# Patient Record
Sex: Male | Born: 1940 | Race: White | Hispanic: No | Marital: Single | Smoking: Never smoker
Health system: Southern US, Community
[De-identification: ages and names within clinical notes are randomized; demographics above are authoritative.]

## PROBLEM LIST (undated history)

## (undated) DIAGNOSIS — M1990 Unspecified osteoarthritis, unspecified site: Secondary | ICD-10-CM

## (undated) DIAGNOSIS — F419 Anxiety disorder, unspecified: Secondary | ICD-10-CM

## (undated) DIAGNOSIS — E785 Hyperlipidemia, unspecified: Secondary | ICD-10-CM

## (undated) DIAGNOSIS — I10 Essential (primary) hypertension: Secondary | ICD-10-CM

## (undated) DIAGNOSIS — M5030 Other cervical disc degeneration, unspecified cervical region: Secondary | ICD-10-CM

## (undated) DIAGNOSIS — K219 Gastro-esophageal reflux disease without esophagitis: Secondary | ICD-10-CM

## (undated) DIAGNOSIS — J309 Allergic rhinitis, unspecified: Secondary | ICD-10-CM

## (undated) DIAGNOSIS — M159 Polyosteoarthritis, unspecified: Secondary | ICD-10-CM

## (undated) DIAGNOSIS — F329 Major depressive disorder, single episode, unspecified: Secondary | ICD-10-CM

## (undated) DIAGNOSIS — F32A Depression, unspecified: Secondary | ICD-10-CM

## (undated) DIAGNOSIS — J984 Other disorders of lung: Secondary | ICD-10-CM

## (undated) HISTORY — DX: Other cervical disc degeneration, unspecified cervical region: M50.30

## (undated) HISTORY — DX: Anxiety disorder, unspecified: F41.9

## (undated) HISTORY — DX: Gastro-esophageal reflux disease without esophagitis: K21.9

## (undated) HISTORY — DX: Essential (primary) hypertension: I10

## (undated) HISTORY — DX: Hyperlipidemia, unspecified: E78.5

## (undated) HISTORY — DX: Depression, unspecified: F32.A

## (undated) HISTORY — DX: Major depressive disorder, single episode, unspecified: F32.9

## (undated) HISTORY — DX: Other disorders of lung: J98.4

## (undated) HISTORY — DX: Allergic rhinitis, unspecified: J30.9

## (undated) HISTORY — DX: Unspecified osteoarthritis, unspecified site: M19.90

## (undated) HISTORY — DX: Polyosteoarthritis, unspecified: M15.9

---
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
## 2011-08-02 ENCOUNTER — Ambulatory Visit: Payer: Self-pay | Admitting: Unknown Physician Specialty

## 2015-06-10 ENCOUNTER — Ambulatory Visit (INDEPENDENT_AMBULATORY_CARE_PROVIDER_SITE_OTHER): Payer: Medicare Other | Admitting: Urology

## 2015-06-10 ENCOUNTER — Encounter: Payer: Self-pay | Admitting: Urology

## 2015-06-10 VITALS — BP 118/69 | HR 71 | Ht 70.0 in | Wt 161.4 lb

## 2015-06-10 DIAGNOSIS — M5030 Other cervical disc degeneration, unspecified cervical region: Secondary | ICD-10-CM | POA: Insufficient documentation

## 2015-06-10 DIAGNOSIS — K219 Gastro-esophageal reflux disease without esophagitis: Secondary | ICD-10-CM | POA: Insufficient documentation

## 2015-06-10 DIAGNOSIS — F5221 Male erectile disorder: Secondary | ICD-10-CM | POA: Diagnosis not present

## 2015-06-10 DIAGNOSIS — J309 Allergic rhinitis, unspecified: Secondary | ICD-10-CM

## 2015-06-10 DIAGNOSIS — N411 Chronic prostatitis: Secondary | ICD-10-CM

## 2015-06-10 DIAGNOSIS — N400 Enlarged prostate without lower urinary tract symptoms: Secondary | ICD-10-CM | POA: Diagnosis not present

## 2015-06-10 DIAGNOSIS — L409 Psoriasis, unspecified: Secondary | ICD-10-CM | POA: Insufficient documentation

## 2015-06-10 DIAGNOSIS — M6530 Trigger finger, unspecified finger: Secondary | ICD-10-CM | POA: Insufficient documentation

## 2015-06-10 DIAGNOSIS — I10 Essential (primary) hypertension: Secondary | ICD-10-CM

## 2015-06-10 DIAGNOSIS — M159 Polyosteoarthritis, unspecified: Secondary | ICD-10-CM | POA: Insufficient documentation

## 2015-06-10 DIAGNOSIS — E7800 Pure hypercholesterolemia, unspecified: Secondary | ICD-10-CM | POA: Insufficient documentation

## 2015-06-10 HISTORY — DX: Essential (primary) hypertension: I10

## 2015-06-10 HISTORY — DX: Gastro-esophageal reflux disease without esophagitis: K21.9

## 2015-06-10 HISTORY — DX: Other cervical disc degeneration, unspecified cervical region: M50.30

## 2015-06-10 HISTORY — DX: Polyosteoarthritis, unspecified: M15.9

## 2015-06-10 HISTORY — DX: Allergic rhinitis, unspecified: J30.9

## 2015-06-10 LAB — URINALYSIS, COMPLETE
Bilirubin, UA: NEGATIVE
Glucose, UA: NEGATIVE
Ketones, UA: NEGATIVE
Leukocytes, UA: NEGATIVE
Nitrite, UA: NEGATIVE
Protein, UA: NEGATIVE
RBC, UA: NEGATIVE
Specific Gravity, UA: 1.01 (ref 1.005–1.030)
Urobilinogen, Ur: 0.2 mg/dL (ref 0.2–1.0)
pH, UA: 7 (ref 5.0–7.5)

## 2015-06-10 LAB — MICROSCOPIC EXAMINATION
Bacteria, UA: NONE SEEN
RBC, UA: NONE SEEN /hpf (ref 0–?)

## 2015-06-10 LAB — BLADDER SCAN AMB NON-IMAGING

## 2015-06-10 NOTE — Progress Notes (Signed)
06/10/2015 1:52 PM   Scott Bowen 1941-01-03 [id number]  Referring provider: No referring provider defined for this encounter.  Chief Complaint  Patient presents with  . Prostatitis    New patient    HPI:  say 74 year old patient who had seen Dr. Orson SlickHarmon many years ago. At that time he been treated or chronic prostatitis. At the present time he is having some voiding dysfunction and some erectile dysfunction. He gets an erection but it is not a very hard erection. He is able to have orgasm with this erection. He does not have difficulty with ejaculation. Secondarily patient has a weak stream which has been increasingly becoming weak over the past year. He has minimal nocturia and in fact goes to bed around midnight and gets up to void around 6 AM he has not had prostatitis infection or dysuria 30 years ago.    PMH: Past Medical History  Diagnosis Date  . GERD (gastroesophageal reflux disease)   . Anxiety   . Arthritis   . Depression   . Hypertension   . Hyperlipemia   . Lung cyst     Surgical History: Past Surgical History  Procedure Laterality Date  . Appendectomy  1961    Home Medications:    Medication List       This list is accurate as of: 06/10/15  1:52 PM.  Always use your most recent med list.               aspirin EC 81 MG tablet  Take by mouth.     cetirizine 10 MG tablet  Commonly known as:  ZYRTEC  Take 10 mg by mouth once a week.     escitalopram 10 MG tablet  Commonly known as:  LEXAPRO  Take by mouth.     lisinopril 10 MG tablet  Commonly known as:  PRINIVIL,ZESTRIL  TAKE ONE (1) TABLET EACH DAY     montelukast 10 MG tablet  Commonly known as:  SINGULAIR     omeprazole 20 MG capsule  Commonly known as:  PRILOSEC     OPCON-A OP  Apply to eye.     triamcinolone 55 MCG/ACT Aero nasal inhaler  Commonly known as:  NASACORT  Place into the nose.        Allergies:  Allergies  Allergen Reactions  . Doxycycline Rash  .  Erythromycin Ethylsuccinate Rash  . Sulfa Antibiotics Hives and Rash  . Tetracycline Rash    Family History: Family History  Problem Relation Age of Onset  . Bladder Cancer Neg Hx   . Prostate cancer Neg Hx   . Kidney cancer Neg Hx     Social History:  reports that he has never smoked. He does not have any smokeless tobacco history on file. He reports that he does not drink alcohol or use illicit drugs.  ROS: UROLOGY Frequent Urination?: Yes Hard to postpone urination?: No Burning/pain with urination?: No Get up at night to urinate?: Yes Leakage of urine?: No Urine stream starts and stops?: No Trouble starting stream?: Yes Do you have to strain to urinate?: No Blood in urine?: No Urinary tract infection?: No Sexually transmitted disease?: No Injury to kidneys or bladder?: No Painful intercourse?: No Weak stream?: Yes Erection problems?: Yes Penile pain?: No  Gastrointestinal Nausea?: No Vomiting?: No Indigestion/heartburn?: No Diarrhea?: No Constipation?: No  Constitutional Fever: No Night sweats?: No Weight loss?: No Fatigue?: No  Skin Skin rash/lesions?: No Itching?: No  Eyes Blurred vision?: No Double  vision?: No  Ears/Nose/Throat Sore throat?: No Sinus problems?: No  Hematologic/Lymphatic Swollen glands?: No Easy bruising?: Yes  Cardiovascular Leg swelling?: No Chest pain?: No  Respiratory Cough?: Yes Shortness of breath?: No  Endocrine Excessive thirst?: No  Musculoskeletal Back pain?: No Joint pain?: No  Neurological Headaches?: No Dizziness?: No  Psychologic Depression?: Yes Anxiety?: Yes  Physical Exam: BP 118/69 mmHg  Pulse 71  Ht  (1.778 m)  Wt 161 lb 6.4 oz (73.211 kg)  BMI 23.16 kg/m2  Constitutional:  Alert and oriented, No acute distress. HEENT: [place] AT, moist mucus membranes.  Trachea midline, no masses. Cardiovascular: No clubbing, cyanosis, or edema. Respiratory: Normal respiratory effort, no increased  work of breathing. GI: Abdomen is soft, nontender, nondistended, no abdominal masses GU: No CVA tenderness.  Circumcised penis normal testes right inguinal hernia. Rectal exam reveals a lax rectal sphincter BPH of grade to 2 over 4 no nodules tumors masses or growths Skin: No rashes, bruises or suspicious lesions. Lymph: No cervical or inguinal adenopathy. Neurologic: Grossly intact, no focal deficits, moving all 4 extremities. Psychiatric: Normal mood and affect.  Laboratory Data: No results found for: WBC, HGB, HCT, MCV, PLT  No results found for: CREATININE  No results found for: PSA  No results found for: TESTOSTERONE  No results found for: HGBA1C  Urinalysis    Component Value Date/Time   GLUCOSEU Negative 06/10/2015 1202   BILIRUBINUR Negative 06/10/2015 1202   NITRITE Negative 06/10/2015 1202   LEUKOCYTESUR Negative 06/10/2015 1202    Pertinent Imaging none  Assessment & Plan:     Erectile dysfunction and voiding dysfunction in the form of a small stream but not frequency urgency or nocturia. Cialis 5 mg for BPH with erectile dysfunction has been prescribed samples given to the patient.  1. Chronic prostatitis  2. BBH - Urinalysis, Complete - BLADDER SCAN AMB NON-IMAGING   No Follow-up on file.  Lorraine Lax, MD  [facility] [address] [place], [place] [address] [phone]

## 2015-06-18 DIAGNOSIS — Z7189 Other specified counseling: Secondary | ICD-10-CM | POA: Insufficient documentation

## 2015-06-18 DIAGNOSIS — Z709 Sex counseling, unspecified: Secondary | ICD-10-CM | POA: Insufficient documentation

## 2015-08-04 ENCOUNTER — Ambulatory Visit (INDEPENDENT_AMBULATORY_CARE_PROVIDER_SITE_OTHER): Payer: Medicare Other | Admitting: Urology

## 2015-08-04 VITALS — BP 108/71 | HR 70 | Ht 69.5 in | Wt 161.4 lb

## 2015-08-04 DIAGNOSIS — F528 Other sexual dysfunction not due to a substance or known physiological condition: Secondary | ICD-10-CM

## 2015-08-04 DIAGNOSIS — N400 Enlarged prostate without lower urinary tract symptoms: Secondary | ICD-10-CM

## 2015-08-04 DIAGNOSIS — F5221 Male erectile disorder: Secondary | ICD-10-CM

## 2015-08-04 LAB — URINALYSIS, COMPLETE
BILIRUBIN UA: NEGATIVE
GLUCOSE, UA: NEGATIVE
KETONES UA: NEGATIVE
LEUKOCYTES UA: NEGATIVE
Nitrite, UA: NEGATIVE
PROTEIN UA: NEGATIVE
RBC, UA: NEGATIVE
Urobilinogen, Ur: 0.2 mg/dL (ref 0.2–1.0)
pH, UA: 5.5 (ref 5.0–7.5)

## 2015-08-04 LAB — MICROSCOPIC EXAMINATION
Bacteria, UA: NONE SEEN
EPITHELIAL CELLS (NON RENAL): NONE SEEN /HPF (ref 0–10)
RBC, UA: NONE SEEN /hpf (ref 0–?)
WBC, UA: NONE SEEN /hpf (ref 0–?)

## 2015-08-04 NOTE — Progress Notes (Signed)
08/04/2015 1:23 PM   Scott Bowen 10-09-1941 [id number]  Referring provider: Marisue Ivan, MD [address] [place], [place] [address]  Chief Complaint  Patient presents with  . Benign Prostatic Hypertrophy    HPI: Combination nocturia 1 now and it was 3 but no results yet with his increased ability to hold erection and erection. Still has elongation of his penis but it isn't hard like it used to be when he was younger. Been on Cialis 5 mg daily for a month. I think he needs another couple of months of Cialis 5 mg. I so given him samples for same.   PMH: Past Medical History  Diagnosis Date  . GERD (gastroesophageal reflux disease)   . Anxiety   . Arthritis   . Depression   . Hypertension   . Hyperlipemia   . Lung cyst     Surgical History: Past Surgical History  Procedure Laterality Date  . Appendectomy  1961    Home Medications:    Medication List       This list is accurate as of: 08/04/15  1:23 PM.  Always use your most recent med list.               aspirin EC 81 MG tablet  Take by mouth.     cetirizine 10 MG tablet  Commonly known as:  ZYRTEC  Take 10 mg by mouth once a week.     lisinopril 10 MG tablet  Commonly known as:  PRINIVIL,ZESTRIL  TAKE ONE (1) TABLET EACH DAY     montelukast 10 MG tablet  Commonly known as:  SINGULAIR     omeprazole 20 MG capsule  Commonly known as:  PRILOSEC     OPCON-A OP  Apply to eye.     tadalafil 5 MG tablet  Commonly known as:  CIALIS  Take 5 mg by mouth daily as needed for erectile dysfunction.     triamcinolone 55 MCG/ACT Aero nasal inhaler  Commonly known as:  NASACORT  Place into the nose.        Allergies:  Allergies  Allergen Reactions  . Doxycycline Rash  . Erythromycin Ethylsuccinate Rash  . Sulfa Antibiotics Hives and Rash  . Tetracycline Rash    Family History: Family History  Problem Relation Age of Onset  . Bladder Cancer Neg Hx   . Prostate cancer Neg Hx     . Kidney cancer Neg Hx     Social History:  reports that he has never smoked. He does not have any smokeless tobacco history on file. He reports that he does not drink alcohol or use illicit drugs.  ROS:                                        Physical Exam: BP 108/71 mmHg  Pulse 70  Ht 5' 9.5" (1.765 m)  Wt 161 lb 7.2 oz (73.233 kg)  BMI 23.51 kg/m2  Constitutional:  Alert and oriented, No acute distress. HEENT: [place] AT, moist mucus membranes.  Trachea midline, no masses. Cardiovascular: No clubbing, cyanosis, or edema. Respiratory: Normal respiratory effort, no increased work of breathing. GI: Abdomen is soft, nontender, nondistended, no abdominal masses GU: No CVA tenderness. Normal penis testes Skin: No rashes, bruises or suspicious lesions. Lymph: No cervical or inguinal adenopathy. Neurologic: Grossly intact, no focal deficits, moving all 4 extremities. Psychiatric: Normal mood and  affect.  Laboratory Data: No results found for: WBC, HGB, HCT, MCV, PLT  No results found for: CREATININE  No results found for: PSA  No results found for: TESTOSTERONE  No results found for: HGBA1C  Urinalysis    Component Value Date/Time   GLUCOSEU Negative 06/10/2015 1202   BILIRUBINUR Negative 06/10/2015 1202   NITRITE Negative 06/10/2015 1202   LEUKOCYTESUR Negative 06/10/2015 1202    Pertinent Imaging:   Assessment & Plan:  Combination sexual dysfunction impotence with hesitancy of urination and nocturia 1 now. It was 3-4 plan keep him on Cialis for another 2 months and haven't rechecked at that time. If Cialis is not helping with his sexual dysfunction patient should consider using Ttimix injections. We need to also check on his PSA. I believe it's been normal with his family doctor but I want to make sure on that visit will recheck samples are given to the patient and he is told take the Cialis 5 mg every other day  1. BPH (benign prostatic  hyperplasia) decreased nocturia to 1 time. This was on only a months of therapy of Cialis 5 mg.  - Urinalysis, Complete   No Follow-up on file.  Lorraine Lax, MD  [facility] [address], [address] [place], [place] [address] [phone]

## 2015-10-07 ENCOUNTER — Ambulatory Visit (INDEPENDENT_AMBULATORY_CARE_PROVIDER_SITE_OTHER): Payer: Medicare Other | Admitting: Urology

## 2015-10-07 ENCOUNTER — Encounter: Payer: Self-pay | Admitting: Urology

## 2015-10-07 VITALS — BP 121/72 | HR 79 | Ht 70.0 in | Wt 165.6 lb

## 2015-10-07 DIAGNOSIS — N400 Enlarged prostate without lower urinary tract symptoms: Secondary | ICD-10-CM

## 2015-10-07 DIAGNOSIS — N529 Male erectile dysfunction, unspecified: Secondary | ICD-10-CM | POA: Diagnosis not present

## 2015-10-07 DIAGNOSIS — M5030 Other cervical disc degeneration, unspecified cervical region: Secondary | ICD-10-CM | POA: Insufficient documentation

## 2015-10-07 DIAGNOSIS — E7800 Pure hypercholesterolemia, unspecified: Secondary | ICD-10-CM | POA: Insufficient documentation

## 2015-10-07 MED ORDER — TADALAFIL 5 MG PO TABS: 5.0000 mg | ORAL_TABLET | Freq: Every day | ORAL | Status: DC | PRN

## 2015-10-07 NOTE — Progress Notes (Signed)
10/07/2015 11:21 AM   Scott Bowen Sep 12, 1941 [id number]  Referring provider: Marisue IvanKanhka Linthavong, MD [phone] [facility] [place], [place] [id number]  Chief Complaint  Patient presents with  . Benign Prostatic Hypertrophy    2 mth f/u     HPI: History of BPH with erectile dysfunction. He's been on Cialis 5 mg. Doing well on this. His voiding has improved. His erectile function is a little bit improved. I would like to supplement him with testosterone replacement therapy (LT RT) is patient will be followed 1-2 MONTHS.    PMH: Past Medical History  Diagnosis Date  . GERD (gastroesophageal reflux disease)   . Anxiety   . Arthritis   . Depression   . Hypertension   . Hyperlipemia   . Lung cyst   . Essential (primary) hypertension 06/10/2015  . Allergic rhinitis 06/10/2015    Overview:  Followed by Dr. Delfin EdisJeungel   . Gastro-esophageal reflux disease without esophagitis 06/10/2015  . DDD (degenerative disc disease), cervical 06/10/2015  . Degenerative joint disease involving multiple joints 06/10/2015    Surgical History: Past Surgical History  Procedure Laterality Date  . Appendectomy  1961    Home Medications:    Medication List       This list is accurate as of: 10/07/15 11:21 AM.  Always use your most recent med list.               aspirin EC 81 MG tablet  Take by mouth.     cetirizine 10 MG tablet  Commonly known as:  ZYRTEC  Take 10 mg by mouth once a week.     EPIPEN 2-PAK 0.3 mg/0.3 mL Soaj injection  Generic drug:  EPINEPHrine     escitalopram 10 MG tablet  Commonly known as:  LEXAPRO  Take by mouth.     fluticasone 50 MCG/ACT nasal spray  Commonly known as:  FLONASE     lisinopril 10 MG tablet  Commonly known as:  PRINIVIL,ZESTRIL  TAKE ONE (1) TABLET EACH DAY     montelukast 10 MG tablet  Commonly known as:  SINGULAIR     omeprazole 20 MG capsule  Commonly known as:  PRILOSEC     OPCON-A OP  Apply to eye.     tadalafil 5 MG tablet  Commonly known as:  CIALIS  Take 5 mg by mouth daily as needed for erectile dysfunction.     triamcinolone 55 MCG/ACT Aero nasal inhaler  Commonly known as:  NASACORT  Place into the nose.        Allergies:  Allergies  Allergen Reactions  . Doxycycline Rash  . Erythromycin Ethylsuccinate Rash  . Sulfa Antibiotics Hives and Rash  . Tetracycline Rash    Family History: Family History  Problem Relation Age of Onset  . Bladder Cancer Neg Hx   . Prostate cancer Neg Hx   . Kidney cancer Neg Hx     Social History:  reports that he has never smoked. He does not have any smokeless tobacco history on file. He reports that he does not drink alcohol or use illicit drugs.  ROS: UROLOGY Frequent Urination?: Yes Hard to postpone urination?: Yes Burning/pain with urination?: No Get up at night to urinate?: Yes Leakage of urine?: No Urine stream starts and stops?: No Trouble starting stream?: Yes Do you have to strain to urinate?: No Blood in urine?: No Urinary tract infection?: No Sexually transmitted disease?: No Injury to kidneys or bladder?: No Painful  intercourse?: No Weak stream?: Yes Erection problems?: Yes Penile pain?: No  Gastrointestinal Nausea?: No Vomiting?: No Indigestion/heartburn?: No Diarrhea?: No Constipation?: No  Constitutional Fever: No Night sweats?: No Weight loss?: No Fatigue?: No  Skin Skin rash/lesions?: No Itching?: No  Eyes Blurred vision?: No Double vision?: No  Ears/Nose/Throat Sore throat?: No Sinus problems?: No  Hematologic/Lymphatic Swollen glands?: No Easy bruising?: No  Cardiovascular Leg swelling?: No Chest pain?: No  Respiratory Cough?: No Shortness of breath?: No  Endocrine Excessive thirst?: No  Musculoskeletal Back pain?: No Joint pain?: No  Neurological Headaches?: No Dizziness?: No  Psychologic Depression?: No Anxiety?: No  Physical Exam: BP 121/72 mmHg  Pulse 79   Ht  (1.778 m)  Wt 165 lb 9.6 oz (75.116 kg)  BMI 23.76 kg/m2  Constitutional:  Alert and oriented, No acute distress. HEENT: [place] AT, moist mucus membranes.  Trachea midline, no masses. Cardiovascular: No clubbing, cyanosis, or edema. Respiratory: Normal respiratory effort, no increased work of breathing. GI: Abdomen is soft, nontender, nondistended, no abdominal masses GU: No CVA tendern the Skin: No rashes, bruises or suspicious lesions. Lymph: No cervical or inguinal adenopathy. Neurologic: Grossly intact, no focal deficits, moving all 4 extremities. Psychiatric: Normal mood and affect.  Laboratory Data: No results found for: WBC, HGB, HCT, MCV, PLT  No results found for: CREATININE  No results found for: PSA  No results found for: TESTOSTERONE  No results found for: HGBA1C  Urinalysis    Component Value Date/Time   GLUCOSEU Negative 08/04/2015 1211   BILIRUBINUR Negative 08/04/2015 1211   NITRITE Negative 08/04/2015 1211   LEUKOCYTESUR Negative 08/04/2015 1211    Pertinent Imaging: None  Assessment & Plan:  BPH with erectile dysfunction. Patient on Cialis 5 mg every other day doing well with this. His voiding is better. His erections are not in the sense that he has an erection large enough for penetration but he does have better partial erection and he was before he started the Cialis 5 mg daily. I plan to start him on testosterone replacement therapy if his serum testosterone 2 is low enough to justify testosterone.  T mg dhere are no diagnoses linked to this encounter.  No Follow-up on file.  Lorraine Lax, MD  [facility] [address] [place], [place] [address] [phone]

## 2015-10-07 NOTE — Addendum Note (Signed)
Addended by: Trey PaulaHART, RICHARD D on: 10/07/2015 11:27 AM   Modules accepted: Orders

## 2015-10-19 ENCOUNTER — Telehealth: Payer: Self-pay

## 2015-10-19 NOTE — Telephone Encounter (Signed)
Pt insurance company called stating pt called them c/o [facility]/Dr. Edwyna ShellHart would not complete PA for cialis 5mg . Nurse made insurance co aware PA was completed last week. While on the phone PA was located. [place]ittany from insurance co stated she would call pt and make aware PA was completed and when results are made he would be notified.

## 2015-11-20 ENCOUNTER — Encounter: Payer: Self-pay | Admitting: Medical Oncology

## 2015-11-20 ENCOUNTER — Emergency Department
Admission: EM | Admit: 2015-11-20 | Discharge: 2015-11-20 | Disposition: A | Discharge status: Home / Self Care | Payer: Medicare Other | Attending: Student | Admitting: Student

## 2015-11-20 ENCOUNTER — Emergency Department: Payer: Medicare Other

## 2015-11-20 DIAGNOSIS — Z7982 Long term (current) use of aspirin: Secondary | ICD-10-CM | POA: Diagnosis not present

## 2015-11-20 DIAGNOSIS — Z79899 Other long term (current) drug therapy: Secondary | ICD-10-CM | POA: Insufficient documentation

## 2015-11-20 DIAGNOSIS — R1013 Epigastric pain: Secondary | ICD-10-CM | POA: Insufficient documentation

## 2015-11-20 DIAGNOSIS — I10 Essential (primary) hypertension: Secondary | ICD-10-CM | POA: Diagnosis not present

## 2015-11-20 DIAGNOSIS — G8929 Other chronic pain: Secondary | ICD-10-CM | POA: Diagnosis not present

## 2015-11-20 DIAGNOSIS — R079 Chest pain, unspecified: Secondary | ICD-10-CM | POA: Diagnosis not present

## 2015-11-20 LAB — CBC
HCT: 42.5 % (ref 40.0–52.0)
HEMOGLOBIN: 14.4 g/dL (ref 13.0–18.0)
MCH: 30.3 pg (ref 26.0–34.0)
MCHC: 34 g/dL (ref 32.0–36.0)
MCV: 89.1 fL (ref 80.0–100.0)
Platelets: 217 10*3/uL (ref 150–440)
RBC: 4.77 MIL/uL (ref 4.40–5.90)
RDW: 13.3 % (ref 11.5–14.5)
WBC: 5.6 10*3/uL (ref 3.8–10.6)

## 2015-11-20 LAB — BASIC METABOLIC PANEL
ANION GAP: 7 (ref 5–15)
BUN: 12 mg/dL (ref 6–20)
CALCIUM: 8.7 mg/dL — AB (ref 8.9–10.3)
CO2: 26 mmol/L (ref 22–32)
Chloride: 105 mmol/L (ref 101–111)
Creatinine, Ser: 0.95 mg/dL (ref 0.61–1.24)
GFR calc Af Amer: >60 mL/min (ref >60)
GFR calc non Af Amer: >60 mL/min (ref >60)
GLUCOSE: 101 mg/dL — AB (ref 65–99)
Potassium: 3.7 mmol/L (ref 3.5–5.1)
Sodium: 138 mmol/L (ref 135–145)

## 2015-11-20 LAB — TROPONIN I

## 2015-11-20 MED ORDER — SUCRALFATE 1 GM/10ML PO SUSP: 1.0000 g | Freq: Four times a day (QID) | ORAL | Status: DC

## 2015-11-20 MED ORDER — GI COCKTAIL ~~LOC~~
30.0000 mL | Freq: Once | ORAL | Status: AC
  Administered 2015-11-20: 30 mL via ORAL

## 2015-11-20 MED ORDER — GI COCKTAIL ~~LOC~~
ORAL | Status: AC
  Administered 2015-11-20: 30 mL via ORAL
  Filled 2015-11-20: qty 30

## 2015-11-20 MED ORDER — SODIUM CHLORIDE 0.9 % IV BOLUS (SEPSIS)
500.0000 mL | Freq: Once | INTRAVENOUS | Status: AC
Start: 2015-11-20 — End: 2015-11-20
  Administered 2015-11-20: 500 mL via INTRAVENOUS

## 2015-11-20 MED ORDER — IOHEXOL 350 MG/ML SOLN
100.0000 mL | Freq: Once | INTRAVENOUS | Status: AC | PRN
  Administered 2015-11-20: 100 mL via INTRAVENOUS

## 2015-11-20 NOTE — ED Notes (Signed)
Patient arrived from CT 

## 2015-11-20 NOTE — ED Notes (Signed)
Patient transported to CT 

## 2015-11-20 NOTE — ED Notes (Signed)
Pt reports that he has been having burning sensation to center of chest and epigastric area, reports feels similar to acid reflux. Has hx of hiatal hernia. Pt reports that he is not sob.

## 2015-11-20 NOTE — ED Provider Notes (Signed)
[facility] Provider Note  ____________________________________________  Time seen: Approximately 6:58 PM  I have reviewed the triage vital signs and the nursing notes.   HISTORY  Chief Complaint Chest Pain    HPI DEIONDRE HARROWER is a 74 y.o. male with history of hypertension, hyperlipidemia, GERD and hiatal hernia who presents for evaluation of acute on chronic burning epigastric/chest pain, constant for the past week, now resolved, gradual onset. The patient reports that he often has this discomfort in the setting of reflux, he increased his omeprazole and at this point reports his pain has resolved. The pain had been constant for the past week, waxing and waning but not resolving until just a few hours ago. Pain not associated with any shortness of breath, is not worse with exertion, is not associated with any sudden sweating, nausea or vomiting. He does report that at some points his pain did radiate towards his back. He has had this pain for several years but reports that this week it has been constant.   Past Medical History  Diagnosis Date  . GERD (gastroesophageal reflux disease)   . Anxiety   . Arthritis   . Depression   . Hypertension   . Hyperlipemia   . Lung cyst   . Essential (primary) hypertension 06/10/2015  . Allergic rhinitis 06/10/2015    Overview:  Followed by Dr. Delfin Edis   . Gastro-esophageal reflux disease without esophagitis 06/10/2015  . DDD (degenerative disc disease), cervical 06/10/2015  . Degenerative joint disease involving multiple joints 06/10/2015    Patient Active Problem List   Diagnosis Date Noted  . Degeneration of intervertebral disc of cervical region 10/07/2015  . Pure hypercholesterolemia 10/07/2015  . Sex counseling 06/18/2015  . Other specified counseling 06/18/2015  . Allergic rhinitis 06/10/2015  . DDD (degenerative disc disease), cervical 06/10/2015  . Essential (primary) hypertension  06/10/2015  . Gastro-esophageal reflux disease without esophagitis 06/10/2015  . Degenerative joint disease involving multiple joints 06/10/2015  . Psoriasis 06/10/2015  . Hypercholesterolemia without hypertriglyceridemia 06/10/2015  . Triggering of digit 06/10/2015    Past Surgical History  Procedure Laterality Date  . Appendectomy  1961    Current Outpatient Rx  Name  Route  Sig  Dispense  Refill  . aspirin EC 81 MG tablet   Oral   Take by mouth.         . cetirizine (ZYRTEC) 10 MG tablet   Oral   Take 10 mg by mouth once a week.         . EPIPEN 2-PAK 0.3 MG/0.3ML SOAJ injection                 Dispense as written.   . escitalopram (LEXAPRO) 10 MG tablet   Oral   Take by mouth.         . fluticasone (FLONASE) 50 MCG/ACT nasal spray               . lisinopril (PRINIVIL,ZESTRIL) 10 MG tablet      TAKE ONE (1) TABLET EACH DAY         . montelukast (SINGULAIR) 10 MG tablet               . Naphazoline-Pheniramine (OPCON-A OP)   Ophthalmic   Apply to eye.         Marland Kitchen omeprazole (PRILOSEC) 20 MG capsule               . tadalafil (CIALIS) 5 MG tablet  Oral   Take 5 mg by mouth daily as needed for erectile dysfunction.         . tadalafil (CIALIS) 5 MG tablet   Oral   Take 1 tablet (5 mg total) by mouth daily as needed for erectile dysfunction.   30 tablet   0   . triamcinolone (NASACORT) 55 MCG/ACT AERO nasal inhaler   Nasal   Place into the nose.           Allergies Doxycycline; Erythromycin ethylsuccinate; Sulfa antibiotics; and Tetracycline  Family History  Problem Relation Age of Onset  . Bladder Cancer Neg Hx   . Prostate cancer Neg Hx   . Kidney cancer Neg Hx     Social History Social History  Substance Use Topics  . Smoking status: Never Smoker   . Smokeless tobacco: None  . Alcohol Use: No    Review of Systems Constitutional: No fever/chills Eyes: No visual changes. ENT: No sore throat. Cardiovascular:  + chest pain. Respiratory: Denies shortness of breath. Gastrointestinal: + epigastric abdominal pain.  No nausea, no vomiting.  No diarrhea.  No constipation. Genitourinary: Negative for dysuria. Musculoskeletal: Negative for back pain. Skin: Negative for rash. Neurological: Negative for headaches, focal weakness or numbness.  10-point ROS otherwise negative.  ____________________________________________   PHYSICAL EXAM:  VITAL SIGNS: ED Triage Vitals  Enc Vitals Group     BP 11/20/15 1729 144/61 mmHg     Pulse Rate 11/20/15 1729 76     Resp 11/20/15 1729 18     Temp 11/20/15 1729 98.1 F (36.7 C)     Temp Source 11/20/15 1729 Oral     SpO2 11/20/15 1729 99 %     Weight 11/20/15 1729 157 lb (71.215 kg)     Height 11/20/15 1729 5\' 10"  (1.778 m)     Head Cir --      Peak Flow --      Pain Score 11/20/15 1730 5     Pain Loc --      Pain Edu? --      Excl. in GC? --     Constitutional: Alert and oriented. Well appearing and in no acute distress. Eyes: Conjunctivae are normal. PERRL. EOMI. Head: Atraumatic. Nose: No congestion/rhinnorhea. Mouth/Throat: Mucous membranes are moist.  Oropharynx non-erythematous. Neck: No stridor.  Cardiovascular: Normal rate, regular rhythm. Grossly normal heart sounds.  Good peripheral circulation. Respiratory: Normal respiratory effort.  No retractions. Lungs CTAB. Gastrointestinal: Soft and nontender. No distention.  No CVA tenderness. Genitourinary: deferred Musculoskeletal: No lower extremity tenderness nor edema.  No joint effusions. Neurologic:  Normal speech and language. No gross focal neurologic deficits are appreciated. No gait instability. Skin:  Skin is warm, dry and intact. No rash noted. Psychiatric: Mood and affect are normal. Speech and behavior are normal.  ____________________________________________   LABS (all labs ordered are listed, but only abnormal results are displayed)  Labs Reviewed  BASIC METABOLIC PANEL -  Abnormal; Notable for the following:    Glucose, Bld 101 (*)    Calcium 8.7 (*)    All other components within normal limits  CBC  TROPONIN I   ____________________________________________  EKG  ED ECG REPORT I, Gayla DossGayle, Sharise Lippy A, the attending physician, personally viewed and interpreted this ECG.   Date: 11/20/2015  EKG Time: 17:26  Rate: 83  Rhythm: normal EKG, normal sinus rhythm  Axis: normal  Intervals:none  ST&T Change: No acute ST elevation  ____________________________________________  RADIOLOGY  CXR IMPRESSION: No active cardiopulmonary  disease.  CTA chest FINDINGS: Heart: Minimal coronary artery calcification noted. Heart size is normal.  Vascular structures: Pulmonary arteries are well opacified. There is no evidence for acute pulmonary embolus. Thoracic aorta is normal in appearance.  Mediastinum/thyroid: Small low-attenuation lesion is identified within the right lobe of the thyroid, measuring 11 mm. No mediastinal, hilar, or axillary adenopathy. The esophagus is normal in appearance.  Lungs/Airways: No pulmonary nodules, pleural effusions, or infiltrates. Airways are patent.  Upper abdomen: Normal in appearance.  Chest wall/osseous structures: Mild degenerative changes are identified in the thoracic spine. No suspicious lytic or blastic lesions are identified.  Review of the MIP images confirms the above findings.  IMPRESSION: 1. Technically adequate exam showing no pulmonary embolus. 2. Mild coronary artery disease. 3. Small on the right thyroid nodule. Given the benign appearance and small size, no further evaluation is necessary based on consensus criteria.   ____________________________________________   PROCEDURES  Procedure(s) performed: None  Critical Care performed: No  ____________________________________________   INITIAL IMPRESSION / ASSESSMENT AND PLAN / ED COURSE  Pertinent labs & imaging results that were available  during my care of the patient were reviewed by me and considered in my medical decision making (see chart for details).  LEOMAR WESTBERG is a 74 y.o. male with history of hypertension, hyperlipidemia, GERD and hiatal hernia who presents for evaluation of acute on chronic burning epigastric/chest pain, constant for the past week, now resolved, gradual onset. On exam, he is very well-appearing and in no acute distress. Currently pain-free. Vital signs stable, he is afebrile. He has a benign physical examination. Normal EKG, negative troponin after several days of constant chest pain, not consistent with ACS. Given age and pain radiating to his back, will obtain CTA chest to rule out aortic dissection however I suspect his symptoms are GI related given history of same.  ----------------------------------------- 8:30 PM on 11/20/2015 -----------------------------------------  CTA chest shows no PE, normal thoracic aorta, no dissection or aneurysm. The patient continues to appear well. Discussed return precautions and need for close PCP follow-up and he is comfortable with the discharge plan. Will add sucralfate to his regimen. ____________________________________________   FINAL CLINICAL IMPRESSION(S) / ED DIAGNOSES  Final diagnoses:  Epigastric pain      Gayla Doss, MD 11/20/15 2031

## 2015-12-11 ENCOUNTER — Other Ambulatory Visit: Payer: Self-pay

## 2015-12-11 DIAGNOSIS — N400 Enlarged prostate without lower urinary tract symptoms: Secondary | ICD-10-CM

## 2015-12-11 MED ORDER — TADALAFIL 5 MG PO TABS: 5.0000 mg | ORAL_TABLET | Freq: Every day | ORAL | Status: DC | PRN

## 2015-12-31 ENCOUNTER — Other Ambulatory Visit: Payer: Self-pay

## 2015-12-31 DIAGNOSIS — E291 Testicular hypofunction: Secondary | ICD-10-CM

## 2016-01-01 ENCOUNTER — Other Ambulatory Visit: Payer: Medicare Other

## 2016-01-01 DIAGNOSIS — E291 Testicular hypofunction: Secondary | ICD-10-CM

## 2016-01-02 LAB — TESTOSTERONE: Testosterone: 831 ng/dL (ref 348–1197)

## 2016-01-04 ENCOUNTER — Telehealth: Payer: Self-pay

## 2016-01-04 NOTE — Telephone Encounter (Signed)
-----   Message from Harle Battiest, PA-C sent at 01/02/2016  6:33 PM EST ----- Patient's testosterone is normal.  I am assuming he is not on testosterone therapy.

## 2016-01-04 NOTE — Telephone Encounter (Signed)
Spoke with pt in reference to testosterone results. Pt stated he is not taking testosterone replacement.

## 2016-01-07 ENCOUNTER — Encounter: Payer: Self-pay | Admitting: Urology

## 2016-01-07 ENCOUNTER — Ambulatory Visit (INDEPENDENT_AMBULATORY_CARE_PROVIDER_SITE_OTHER): Payer: Medicare Other | Admitting: Urology

## 2016-01-07 VITALS — Ht 70.0 in | Wt 163.6 lb

## 2016-01-07 DIAGNOSIS — N529 Male erectile dysfunction, unspecified: Secondary | ICD-10-CM

## 2016-01-07 DIAGNOSIS — R6882 Decreased libido: Secondary | ICD-10-CM | POA: Diagnosis not present

## 2016-01-07 DIAGNOSIS — N400 Enlarged prostate without lower urinary tract symptoms: Secondary | ICD-10-CM

## 2016-01-07 NOTE — Progress Notes (Signed)
01/07/2016 2:35 PM   Scott Bowen April 27, 1941 [id number]  Referring provider: Marisue Ivan, MD [phone] [facility] [address] [facility] [place], [place] [address]  Chief Complaint  Patient presents with  . Erectile Dysfunction    follow up  . Benign Prostatic Hypertrophy    HPI: The patient is a 75 year old gentleman who presents for follow-up for his BPH and erectile dysfunction. He is on Cialis 5 mg for this. He is very happy with his urinary symptoms. He denies daytime frequency or weak stream. He has nocturia 2. In terms of erections, he is not currently sexually active and has not much interest in sexual intercourse this time. His testosterone is 831. He is somewhat concerned by his lack of sexual desire, however he is not interested in further pursuing this. He denies any other changes to his Intestinal or Urinary Symptoms.  PMH: Past Medical History  Diagnosis Date  . GERD (gastroesophageal reflux disease)   . Anxiety   . Arthritis   . Depression   . Hypertension   . Hyperlipemia   . Lung cyst   . Essential (primary) hypertension 06/10/2015  . Allergic rhinitis 06/10/2015    Overview:  Followed by Dr. Delfin Edis   . Gastro-esophageal reflux disease without esophagitis 06/10/2015  . DDD (degenerative disc disease), cervical 06/10/2015  . Degenerative joint disease involving multiple joints 06/10/2015    Surgical History: Past Surgical History  Procedure Laterality Date  . Appendectomy  1961    Home Medications:    Medication List       This list is accurate as of: 01/07/16  2:35 PM.  Always use your most recent med list.               aspirin EC 81 MG tablet  Take by mouth.     cetirizine 10 MG tablet  Commonly known as:  ZYRTEC  Take 10 mg by mouth once a week.     EPIPEN 2-PAK 0.3 mg/0.3 mL Soaj injection  Generic drug:  EPINEPHrine  Reported on 01/07/2016     fluticasone 50 MCG/ACT nasal spray  Commonly known as:  FLONASE     lisinopril 10  MG tablet  Commonly known as:  PRINIVIL,ZESTRIL  TAKE ONE (1) TABLET EACH DAY     montelukast 10 MG tablet  Commonly known as:  SINGULAIR     OPCON-A OP  Apply to eye.     ranitidine 150 MG tablet  Commonly known as:  ZANTAC  Take 150 mg by mouth 2 (two) times daily.     sucralfate 1 GM/10ML suspension  Commonly known as:  CARAFATE  Take 10 mLs (1 g total) by mouth 4 (four) times daily. Take 1 hour before meals.     tadalafil 5 MG tablet  Commonly known as:  CIALIS  Take 1 tablet (5 mg total) by mouth daily as needed for erectile dysfunction.        Allergies:  Allergies  Allergen Reactions  . Omeprazole Hives  . Doxycycline Rash  . Erythromycin Ethylsuccinate Rash  . Sulfa Antibiotics Hives and Rash  . Tetracycline Rash    Family History: Family History  Problem Relation Age of Onset  . Bladder Cancer Neg Hx   . Prostate cancer Neg Hx   . Kidney cancer Neg Hx     Social History:  reports that he has never smoked. He does not have any smokeless tobacco history on file. He reports that he does not drink alcohol or  use illicit drugs.  ROS:                                        Physical Exam: Ht  (1.778 m)  Wt 163 lb 9.6 oz (74.208 kg)  BMI 23.47 kg/m2  Constitutional:  Alert and oriented, No acute distress. HEENT: [place] AT, moist mucus membranes.  Trachea midline, no masses. Cardiovascular: No clubbing, cyanosis, or edema. Respiratory: Normal respiratory effort, no increased work of breathing. GI: Abdomen is soft, nontender, nondistended, no abdominal masses GU: No CVA tenderness.  Skin: No rashes, bruises or suspicious lesions. Lymph: No cervical or inguinal adenopathy. Neurologic: Grossly intact, no focal deficits, moving all 4 extremities. Psychiatric: Normal mood and affect.  Laboratory Data: Lab Results  Component Value Date   WBC 5.6 11/20/2015   HGB 14.4 11/20/2015   HCT 42.5 11/20/2015   MCV 89.1 11/20/2015   PLT  217 11/20/2015    Lab Results  Component Value Date   CREATININE 0.95 11/20/2015    No results found for: PSA  Lab Results  Component Value Date   TESTOSTERONE 831 01/01/2016    No results found for: HGBA1C  Urinalysis    Component Value Date/Time   GLUCOSEU Negative 08/04/2015 1211   BILIRUBINUR Negative 08/04/2015 1211   NITRITE Negative 08/04/2015 1211   LEUKOCYTESUR Negative 08/04/2015 1211    Assessment & Plan:   1. BPH Continue Cialis 5 mg daily  2. Erectile dysfunction Continue Cialis 5 mg daily  3. Low libido  I had a very long discussion with the patient regarding his low libido. There is no medical reason for this if his testosterone is well within normal limits. I offered him the opportunity to speak with sexual therapist, but he is not interested in that at this time. He currently does not have a sexual partner.   Follow up in 6 months.    No Follow-up on file.  Hildred Laser, MD  [facility] [address], [address] [place], [place] [address] [phone]

## 2016-01-18 ENCOUNTER — Telehealth: Payer: Self-pay

## 2016-01-18 NOTE — Telephone Encounter (Signed)
Pt called in stating he has been having hives, rash, and severe itching. Pt stated that he stopped all medications and restarted them one by one and it appears to him that the cialis is the problem. Pt states that he has been taking benadryl and is able to handle the reaction at this point but would like to have a new medication. Reinforced with pt if he reactions gets to the point he is not able to breathe or reaction gets worse to go to the ER. Pt voiced understanding. Pt was transferred to the front to make f/u appt.

## 2016-01-22 ENCOUNTER — Ambulatory Visit (INDEPENDENT_AMBULATORY_CARE_PROVIDER_SITE_OTHER): Payer: Medicare Other | Admitting: Urology

## 2016-01-22 VITALS — BP 137/67 | HR 70 | Ht 70.0 in | Wt 164.0 lb

## 2016-01-22 DIAGNOSIS — R6882 Decreased libido: Secondary | ICD-10-CM | POA: Diagnosis not present

## 2016-01-22 DIAGNOSIS — N529 Male erectile dysfunction, unspecified: Secondary | ICD-10-CM

## 2016-01-22 DIAGNOSIS — N400 Enlarged prostate without lower urinary tract symptoms: Secondary | ICD-10-CM

## 2016-01-22 MED ORDER — SILODOSIN 8 MG PO CAPS: 8.0000 mg | ORAL_CAPSULE | Freq: Every day | ORAL | Status: AC

## 2016-01-22 NOTE — Progress Notes (Signed)
01/22/2016 3:53 PM   Scott Bowen 11-15-1941 [id number]  Referring provider: Marisue IvanKanhka Linthavong, MD [phone] [facility] [address] [facility] [place], [place] [id number]  Chief Complaint  Patient presents with  . Erectile Dysfunction    HPI: The patient is a 75 year old gentleman who presents for follow-up for his BPH and erectile dysfunction. He is on Cialis 5 mg for this. He is very happy with his urinary symptoms. He denies daytime frequency or weak stream. He has nocturia 2. In terms of erections, he is not currently sexually active and has not much interest in sexual intercourse this time. His testosterone is 831. He is somewhat concerned by his lack of sexual desire, however he is not interested in further pursuing this.    March 2017 interval history: The patient returns today to discuss his Cialis. He feels that this medication gave him hives and itching recently.  He has been on the medication for some time now though.  He noticed some itching and hives  Which stopped when he took his medication. He is fairly certain is the Cialis that causes this reaction.  PMH: Past Medical History  Diagnosis Date  . GERD (gastroesophageal reflux disease)   . Anxiety   . Arthritis   . Depression   . Hypertension   . Hyperlipemia   . Lung cyst   . Essential (primary) hypertension 06/10/2015  . Allergic rhinitis 06/10/2015    Overview:  Followed by Dr. Delfin EdisJeungel   . Gastro-esophageal reflux disease without esophagitis 06/10/2015  . DDD (degenerative disc disease), cervical 06/10/2015  . Degenerative joint disease involving multiple joints 06/10/2015    Surgical History: Past Surgical History  Procedure Laterality Date  . Appendectomy  1961    Home Medications:    Medication List       This list is accurate as of: 01/22/16  3:53 PM.  Always use your most recent med list.               aspirin EC 81 MG tablet  Take by mouth.     cetirizine 10 MG tablet  Commonly known as:   ZYRTEC  Take 10 mg by mouth once a week.     EPIPEN 2-PAK 0.3 mg/0.3 mL Soaj injection  Generic drug:  EPINEPHrine  Reported on 01/07/2016     fluticasone 50 MCG/ACT nasal spray  Commonly known as:  FLONASE     lisinopril 10 MG tablet  Commonly known as:  PRINIVIL,ZESTRIL  TAKE ONE (1) TABLET EACH DAY     montelukast 10 MG tablet  Commonly known as:  SINGULAIR     OPCON-A OP  Apply to eye.     ranitidine 150 MG tablet  Commonly known as:  ZANTAC  Take 150 mg by mouth 2 (two) times daily.     sucralfate 1 GM/10ML suspension  Commonly known as:  CARAFATE  Take 10 mLs (1 g total) by mouth 4 (four) times daily. Take 1 hour before meals.        Allergies:  Allergies  Allergen Reactions  . Cialis [Tadalafil] Hives and Itching  . Omeprazole Hives  . Doxycycline Rash  . Erythromycin Ethylsuccinate Rash  . Sulfa Antibiotics Hives and Rash  . Tetracycline Rash    Family History: Family History  Problem Relation Age of Onset  . Bladder Cancer Neg Hx   . Prostate cancer Neg Hx   . Kidney cancer Neg Hx     Social History:  reports that he has  never smoked. He does not have any smokeless tobacco history on file. He reports that he does not drink alcohol or use illicit drugs.  ROS:                                        Physical Exam: BP 137/67 mmHg  Pulse 70  Ht  (1.778 m)  Wt 164 lb (74.39 kg)  BMI 23.53 kg/m2  Constitutional:  Alert and oriented, No acute distress. HEENT:  Bend AT, moist mucus membranes.  Trachea midline, no masses. Cardiovascular: No clubbing, cyanosis, or edema. Respiratory: Normal respiratory effort, no increased work of breathing. GI: Abdomen is soft, nontender, nondistended, no abdominal masses GU: No CVA tenderness.  Skin: No rashes, bruises or suspicious lesions. Lymph: No cervical or inguinal adenopathy. Neurologic: Grossly intact, no focal deficits, moving all 4 extremities. Psychiatric: Normal mood and  affect.  Laboratory Data: Lab Results  Component Value Date   WBC 5.6 11/20/2015   HGB 14.4 11/20/2015   HCT 42.5 11/20/2015   MCV 89.1 11/20/2015   PLT 217 11/20/2015    Lab Results  Component Value Date   CREATININE 0.95 11/20/2015    No results found for: PSA  Lab Results  Component Value Date   TESTOSTERONE 831 01/01/2016    No results found for: HGBA1C  Urinalysis    Component Value Date/Time   GLUCOSEU Negative 08/04/2015 1211   BILIRUBINUR Negative 08/04/2015 1211   NITRITE Negative 08/04/2015 1211   LEUKOCYTESUR Negative 08/04/2015 1211    Assessment & Plan:     I'm not totally convinced that the patient had a true allergy to Cialis, however we will stop this medication at this time. In regards to his BPH, I offered to start him on Rapaflo 8 mg daily. As one of the risk of orthostatic hypotension. Regards to erectile dysfunction, technically speaking if he has hives from Cialis he is not a candidate for other oral  Phosphodiesterase inhibitors. He notes that he is really not interested in obtaining erections at this time and is more concerned with urination symptoms.  He will follow-up in 6 months or sooner if his.thoughts change in treating his erectile dysfunction.  1. BPH Rapaflo 8 mg daily.  He was warned of the risk of orthostatic hypotension.  2. Erectile dysfunction He is not interested in further treatment for ED at this time  3. Low libido I had a very long discussion with the patient regarding his low libido. There is no medical reason for this if his testosterone is well within normal limits. I offered him the opportunity to speak with sexual therapist, but he is not interested in that at this time. He currently does not have a sexual partner.  Return in about 6 months (around 07/24/2016).  Hildred Laser, MD  [facility] [address], [address] [place], [place] [address] [phone]

## 2016-06-22 IMAGING — CT CT ANGIO CHEST
2 series · 15 of 32 positions shown · IV contrast (APPLIED)
Comparison: Chest x-ray 11/20/2015

CLINICAL DATA: Pt reports that he has been having burning sensation
to center of chest and epigastric area, reports feels similar to
acid reflux. Has hx of hiatal hernia. Pt reports that he is not sob.

EXAM:
CT ANGIOGRAPHY CHEST WITH CONTRAST
TECHNIQUE: Multidetector CT imaging of the chest was performed using the
standard protocol during bolus administration of intravenous
contrast. Multiplanar CT image reconstructions and MIPs were
obtained to evaluate the vascular anatomy.
CONTRAST:  100mL OMNIPAQUE IOHEXOL 350 MG/ML SOLN

[Series 2: routine chest wo · axial · 0.71mm/px · z∈[-679,-394]mm · 7 of 77 slices shown]
[im 10/77  lung]
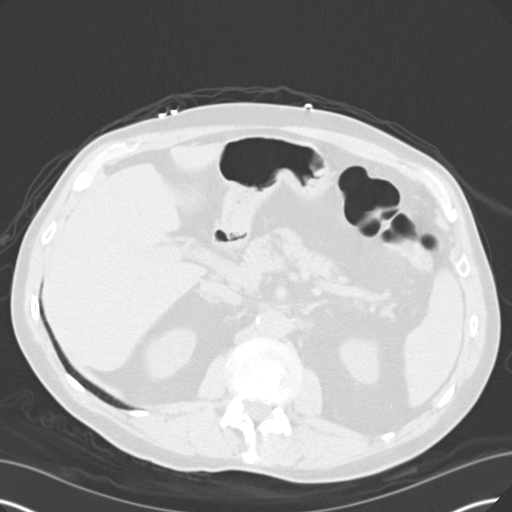
[im 20/77  soft-tissue]
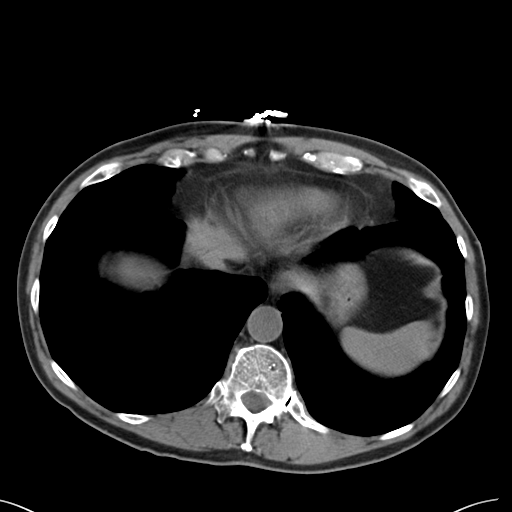
[im 29/77  lung]
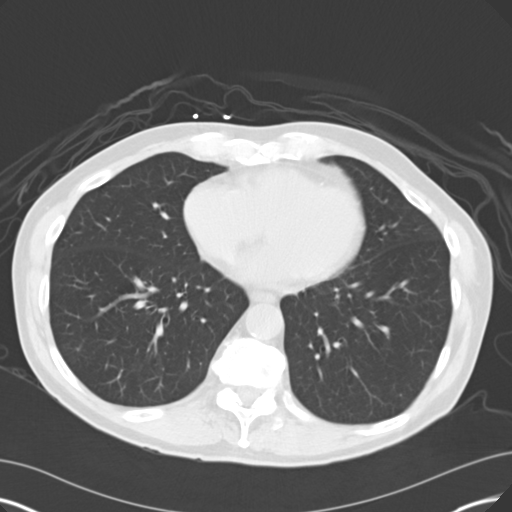
[im 39/77  soft-tissue]
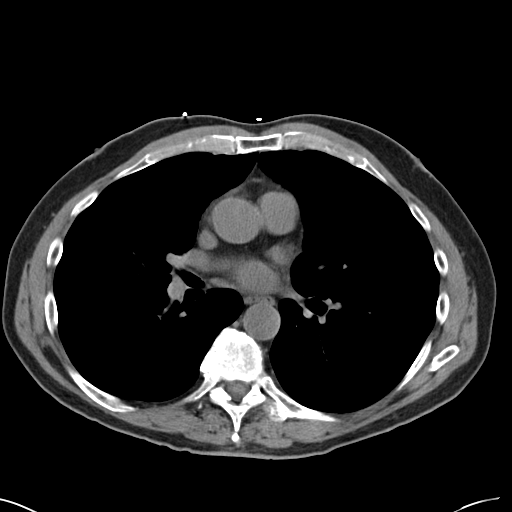
[im 48/77  lung]
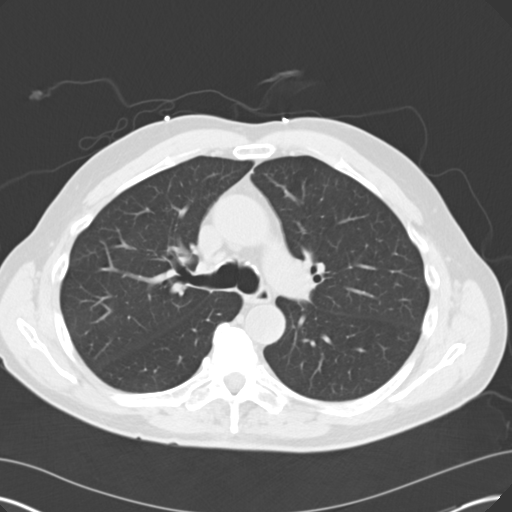
[im 58/77  soft-tissue]
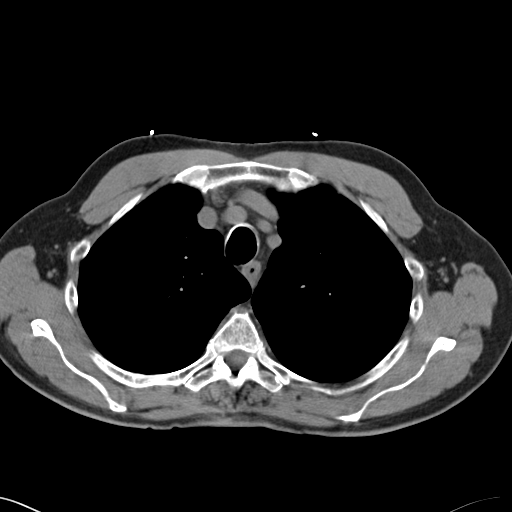
[im 67/77  lung]
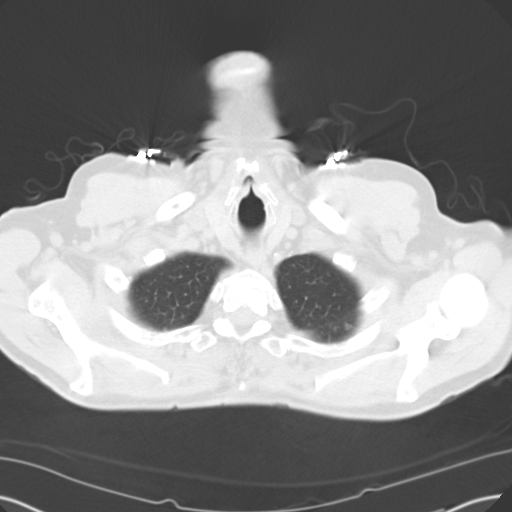

[Series 6: cta chest · axial · 0.68mm/px · z∈[-684,-378]mm · 8 of 189 slices shown]
[im 18/189  lung]
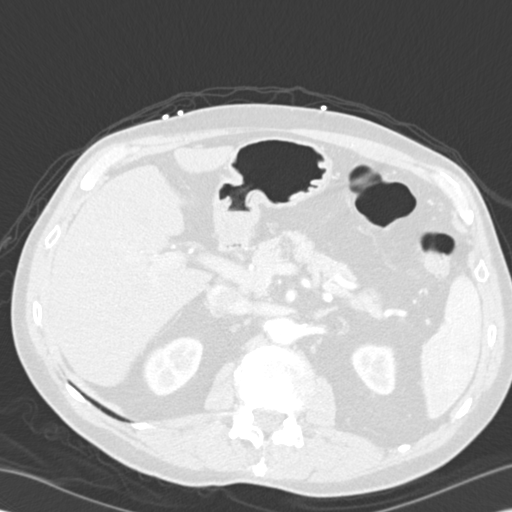
[im 35/189  lung]
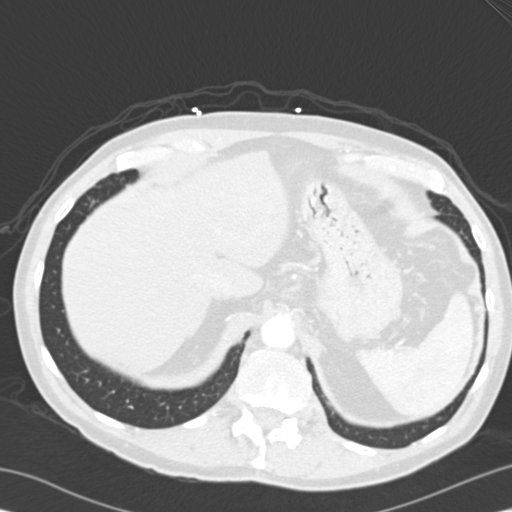
[im 60/189  lung]
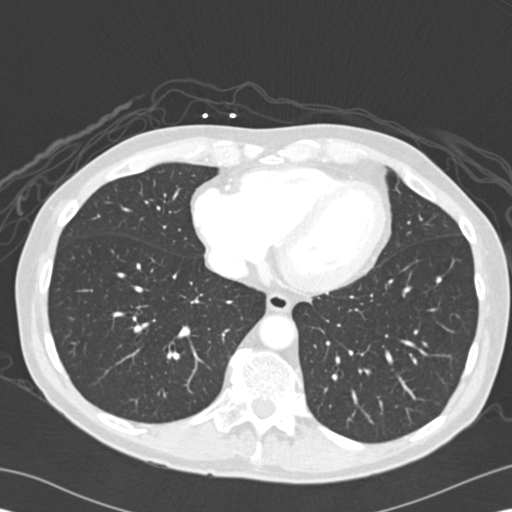
[im 86/189  lung]
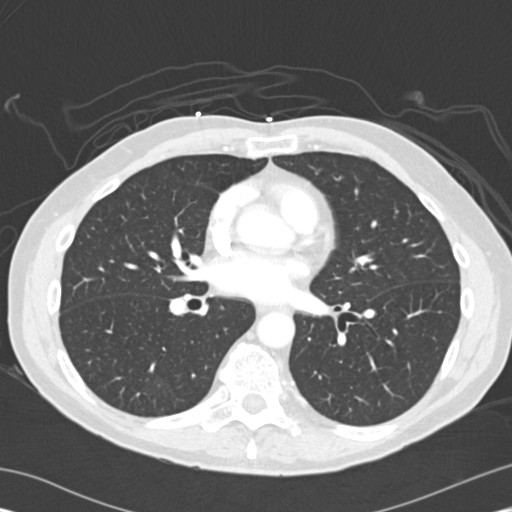
[im 103/189  lung]
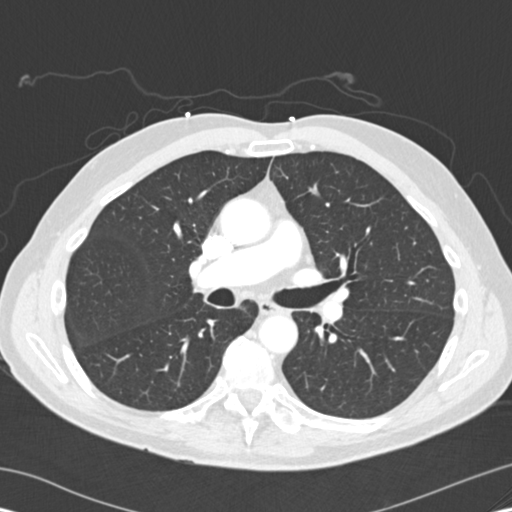
[im 129/189  lung]
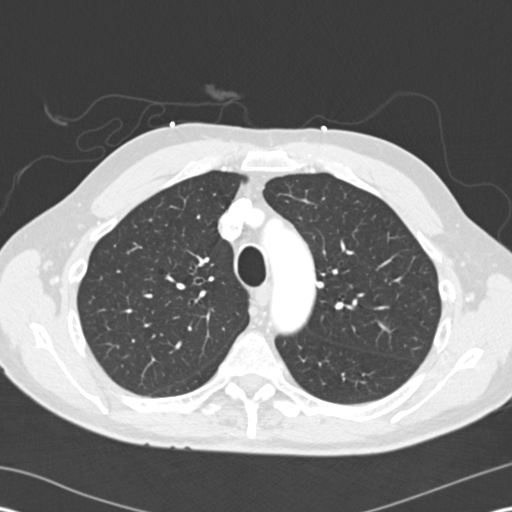
[im 154/189  lung]
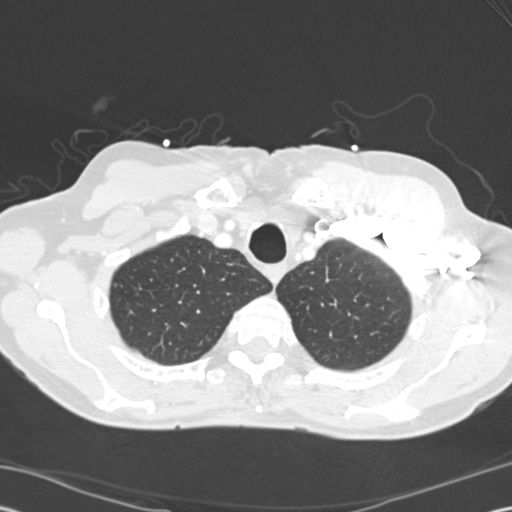
[im 171/189  lung]
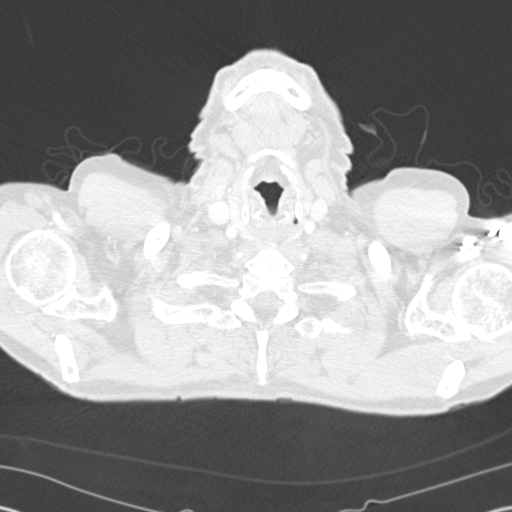

[15 of 32 positions shown; findings below may reference images not displayed]

FINDINGS: Heart: Minimal coronary artery calcification noted. Heart size is
normal.

Vascular structures: Pulmonary arteries are well opacified. There is
no evidence for acute pulmonary embolus. Thoracic aorta is normal in
appearance.

Mediastinum/thyroid: Small low-attenuation lesion is identified
within the right lobe of the thyroid, measuring 11 mm. No
mediastinal, hilar, or axillary adenopathy. The esophagus is normal
in appearance.

Lungs/Airways: No pulmonary nodules, pleural effusions, or
infiltrates. Airways are patent.

Upper abdomen: Normal in appearance.

Chest wall/osseous structures: Mild degenerative changes are
identified in the thoracic spine. No suspicious lytic or blastic
lesions are identified.

Review of the MIP images confirms the above findings.
IMPRESSION: 1. Technically adequate exam showing no pulmonary embolus.
2. Mild coronary artery disease.
3. Small on the right thyroid nodule. Given the benign appearance
and small size, no further evaluation is necessary based on
consensus criteria.

## 2016-06-22 IMAGING — CR DG CHEST 2V
1 series · 2 of 2 positions shown · non-contrast
Comparison: None.

CLINICAL DATA: A 74-year-old male with chest pain

EXAM:
CHEST  2 VIEW

[Series 1: dg chest 2 view · 0.14mm/px · 2 of 2 slices shown]
[im 1/2]
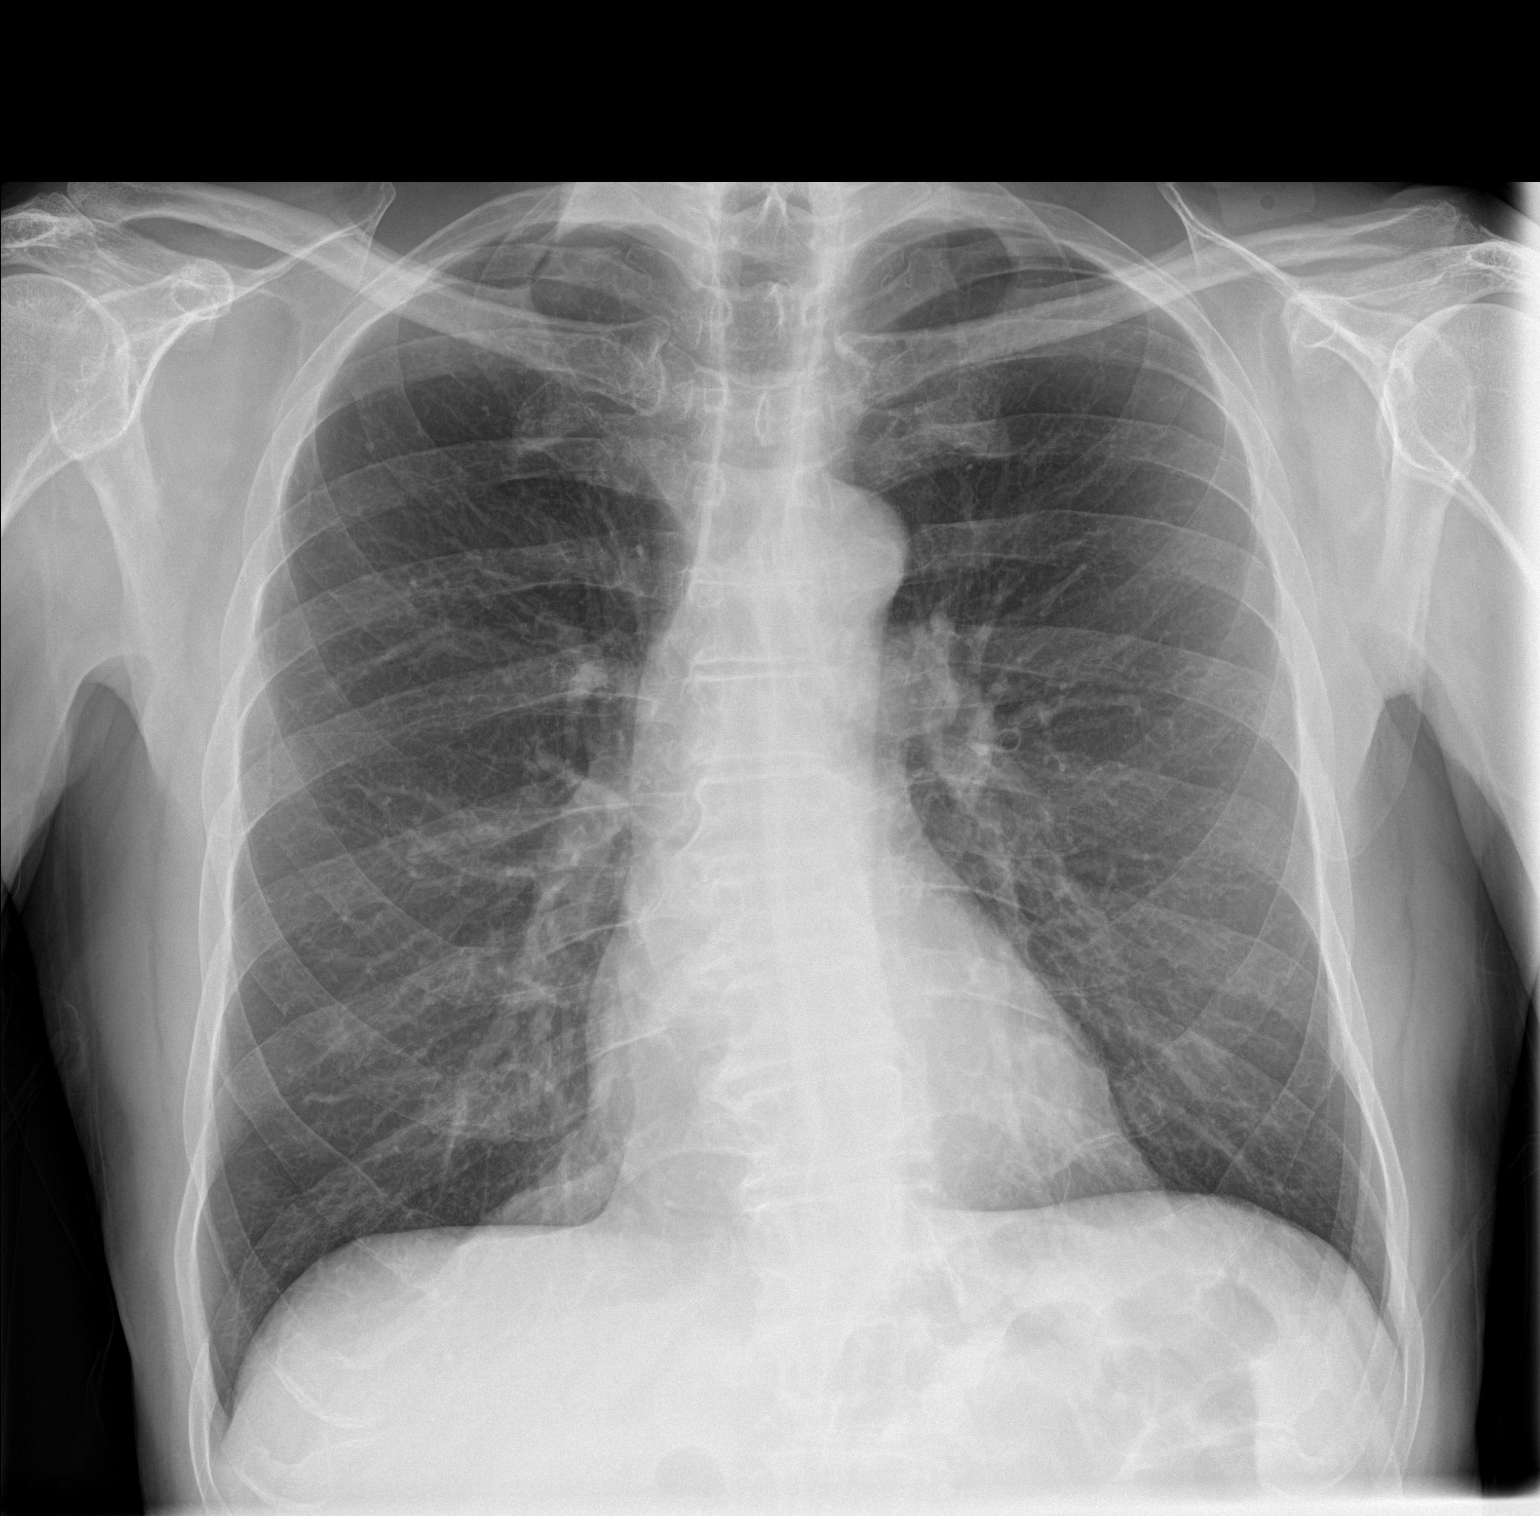
[im 2/2]
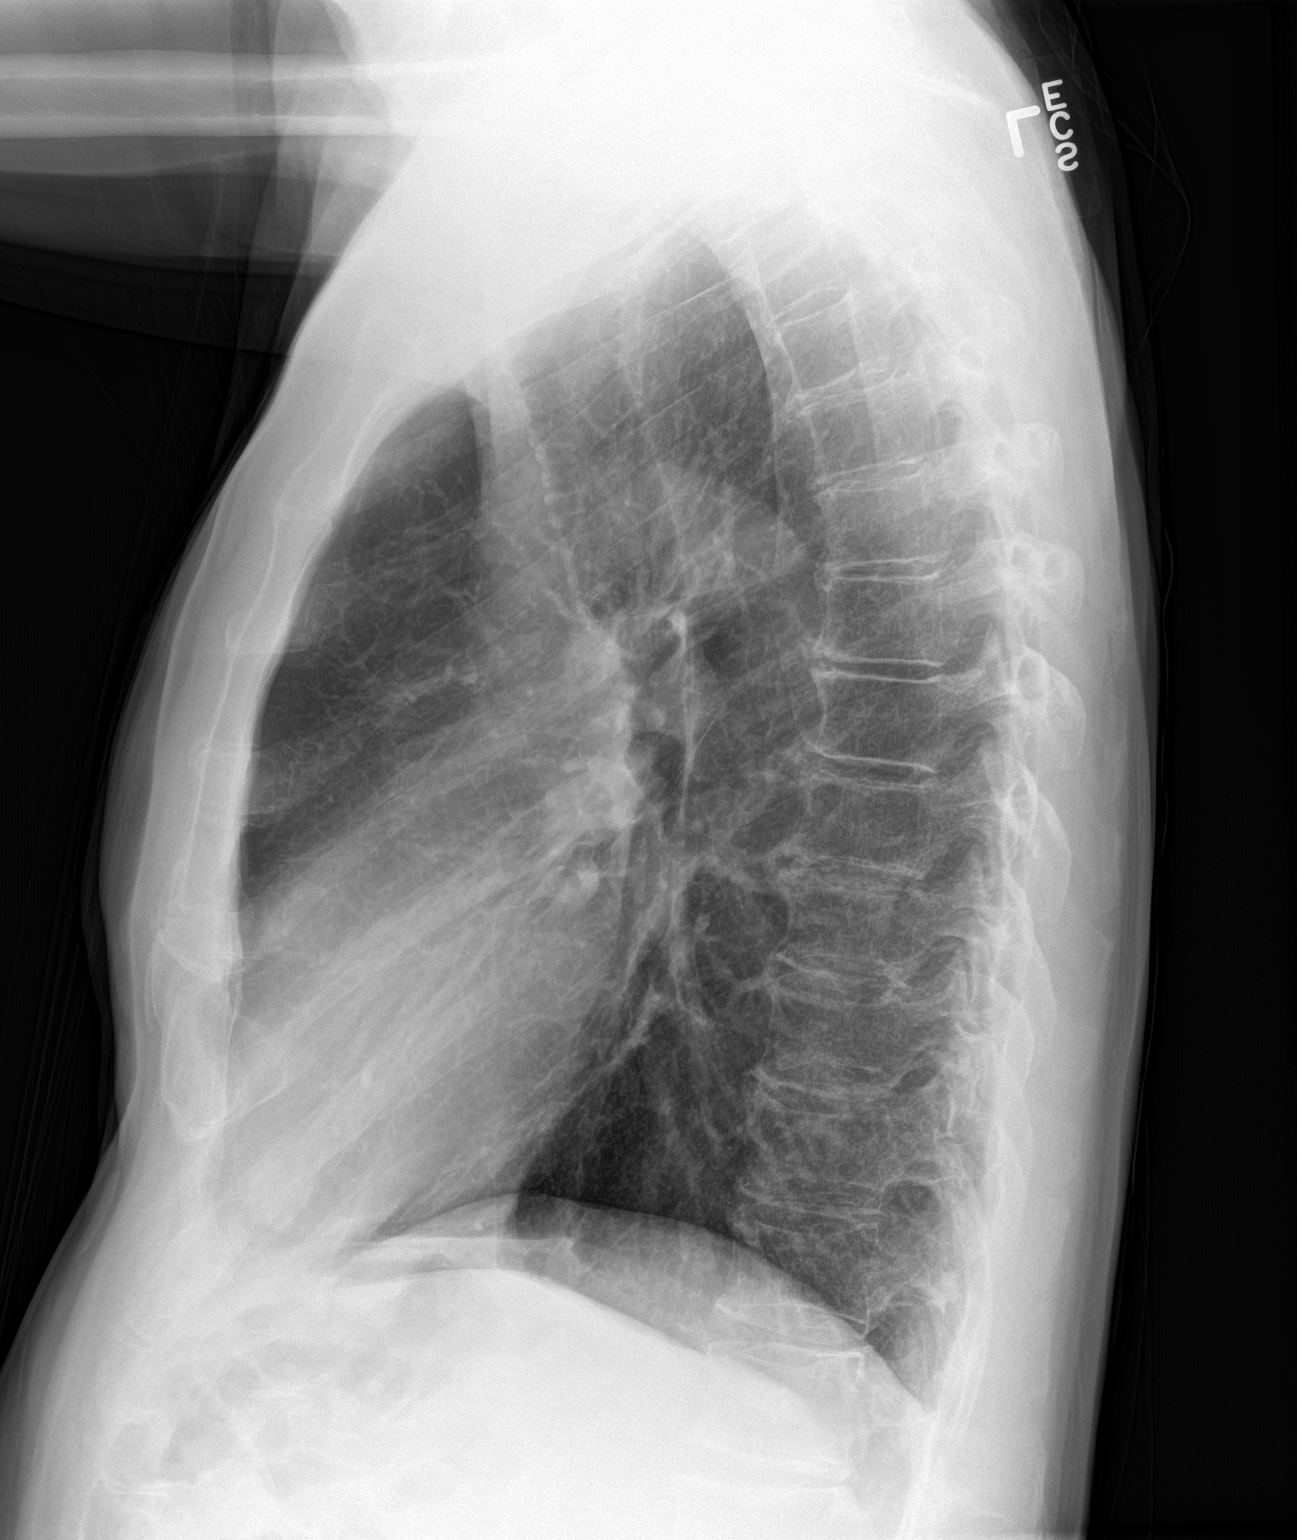

[2 of 2 positions shown; findings below may reference images not displayed]

FINDINGS: The heart size and mediastinal contours are within normal limits.
Both lungs are clear. The visualized skeletal structures are
unremarkable.
IMPRESSION: No active cardiopulmonary disease.

## 2016-07-07 ENCOUNTER — Ambulatory Visit: Payer: Medicare Other

## 2016-07-21 ENCOUNTER — Ambulatory Visit: Payer: Medicare Other

## 2016-08-12 ENCOUNTER — Ambulatory Visit: Payer: Medicare Other

## 2017-03-01 DIAGNOSIS — F330 Major depressive disorder, recurrent, mild: Secondary | ICD-10-CM | POA: Insufficient documentation

## 2018-04-20 ENCOUNTER — Ambulatory Visit
Admission: RE | Admit: 2018-04-20 | Payer: Medicare Other | Source: Ambulatory Visit | Admitting: Unknown Physician Specialty

## 2018-04-20 ENCOUNTER — Encounter: Admission: RE | Payer: Self-pay | Source: Ambulatory Visit

## 2018-04-20 SURGERY — ESOPHAGOGASTRODUODENOSCOPY (EGD) WITH PROPOFOL
Anesthesia: General

## 2018-06-06 ENCOUNTER — Encounter: Payer: Self-pay | Admitting: Occupational Therapy

## 2018-06-06 ENCOUNTER — Ambulatory Visit: Payer: Medicare Other | Attending: Rheumatology | Admitting: Occupational Therapy

## 2018-06-06 ENCOUNTER — Other Ambulatory Visit: Payer: Self-pay

## 2018-06-06 DIAGNOSIS — M79641 Pain in right hand: Secondary | ICD-10-CM | POA: Diagnosis present

## 2018-06-06 DIAGNOSIS — M25641 Stiffness of right hand, not elsewhere classified: Secondary | ICD-10-CM | POA: Insufficient documentation

## 2018-06-06 NOTE — Therapy (Signed)
[facility] [address] [place], [address] Phone: [phone]   Fax:  [phone]  Occupational Therapy Evaluation  Patient Details  Name: Scott Bowen MRN: [id number] Date of Birth: 10-22-1941 Referring Provider: Gavin Potters   Encounter Date: 06/06/2018  OT End of Session - 06/06/18 1749    Visit Number  1    Number of Visits  1    Date for OT Re-Evaluation  06/06/18    OT Start Time  1101    OT Stop Time  1140    OT Time Calculation (min)  39 min    Activity Tolerance  Patient tolerated treatment well    Behavior During Therapy  [facility] for tasks assessed/performed       Past Medical History:  Diagnosis Date  . Allergic rhinitis 06/10/2015   Overview:  Followed by Dr. Delfin Edis   . Anxiety   . Arthritis   . DDD (degenerative disc disease), cervical 06/10/2015  . Degenerative joint disease involving multiple joints 06/10/2015  . Depression   . Essential (primary) hypertension 06/10/2015  . Gastro-esophageal reflux disease without esophagitis 06/10/2015  . GERD (gastroesophageal reflux disease)   . Hyperlipemia   . Hypertension   . Lung cyst     Past Surgical History:  Procedure Laterality Date  . APPENDECTOMY  1961    There were no vitals filed for this visit.  Subjective Assessment - 06/06/18 1744    Subjective    I had trigger fingers in past and had shot - Dr Gavin Potters gave me shot for this middle finger and now it feels great - it was mostly stiff and painfull - and then my L thumb has some shooting pain at time     Patient Stated Goals  Want to make sure I stay pain free in my hands -    Currently in Pain?  No/denies        [facility] OT Assessment - 06/06/18 0001      Assessment   Medical Diagnosis  R trigger finger 3rd digit and hand pain     Referring Provider  kernodle    Onset Date/Surgical Date  04/21/18    Hand Dominance  Right      Prior Function   Vocation  Retired    Leisure   Museum/gallery curator, garden, chorus, [employer] work [employer]       AROM   Overall AROM Comments  Bilateral hands - digits AROM WNL - some arthritic changes at [facility]       Strength   Right Hand Grip (lbs)  60    Right Hand Lateral Pinch  16 lbs    Right Hand 3 Point Pinch  12 lbs    Left Hand Grip (lbs)  51    Left Hand Lateral Pinch  15 lbs    Left Hand 3 Point Pinch  12 lbs         Pt to cont to use some warm water in am for stiffness  and then Tendon glides  And use his thumb splint preventative in act that cause pain or later pain   Joint protection ed done and hand out provided                OT Education - 06/06/18 1748    Education Details  findings and homeprogram     Person(s) Educated  Patient    Methods  Explanation;Demonstration;Handout    Comprehension  Verbalized understanding;Returned demonstration  Plan - 06/06/18 1749    Clinical Impression Statement  Pt refer to OT for trigger finger , pain and stiffness in R 3rd digit - pt had shot about 4 wks ago and report that his pain and stiffness is better - report at times feels shooting pain in L thumb - but otherwise doing okay - pt do have bilateral CMC splint for htumb - pt ed on using them preventitive - and ed on HEP to maintain his AROM in digits and decrease stiffness- pt AROM WNL and grip and prehension strenght is  with in normal range for his age - pt do not need OT services at this time     OT Frequency  One time visit    Consulted and Agree with Plan of Care  Patient       Patient will benefit from skilled therapeutic intervention in order to improve the following deficits and impairments:     Visit Diagnosis: Pain in right hand - Plan: Ot plan of care cert/re-cert  Stiffness of right hand, not elsewhere classified - Plan: Ot plan of care cert/re-cert    Problem List Patient Active Problem List   Diagnosis Date Noted  . Degeneration of intervertebral disc of cervical region  10/07/2015  . Pure hypercholesterolemia 10/07/2015  . Sex counseling 06/18/2015  . Other specified counseling 06/18/2015  . Allergic rhinitis 06/10/2015  . DDD (degenerative disc disease), cervical 06/10/2015  . Essential (primary) hypertension 06/10/2015  . Gastro-esophageal reflux disease without esophagitis 06/10/2015  . Degenerative joint disease involving multiple joints 06/10/2015  . Psoriasis 06/10/2015  . Hypercholesterolemia without hypertriglyceridemia 06/10/2015  . Triggering of digit 06/10/2015    Oletta CohnuPreez, Suella Cogar  OTR/l,CLT 06/06/2018, 5:53 PM  [facility] [address] [place], [place], [address] Phone: [phone]   Fax:  [phone]  Name: Scott Bowen MRN: [id number] Date of Birth: 26-Nov-1940

## 2018-06-06 NOTE — Patient Instructions (Signed)
Pt to cont to use some warm water in am for stiffness  and then Tendon glides  And use his thumb splint preventative in act that cause pain or later pain   Joint protection ed done and hand out provided

## 2018-08-21 ENCOUNTER — Ambulatory Visit (INDEPENDENT_AMBULATORY_CARE_PROVIDER_SITE_OTHER): Payer: Medicare Other | Admitting: Urology

## 2018-08-21 ENCOUNTER — Encounter: Payer: Self-pay | Admitting: Urology

## 2018-08-21 VITALS — BP 137/87 | Ht 70.0 in | Wt 167.3 lb

## 2018-08-21 DIAGNOSIS — R3912 Poor urinary stream: Secondary | ICD-10-CM

## 2018-08-21 DIAGNOSIS — N138 Other obstructive and reflux uropathy: Secondary | ICD-10-CM | POA: Diagnosis not present

## 2018-08-21 DIAGNOSIS — R300 Dysuria: Secondary | ICD-10-CM | POA: Diagnosis not present

## 2018-08-21 DIAGNOSIS — N401 Enlarged prostate with lower urinary tract symptoms: Secondary | ICD-10-CM | POA: Diagnosis not present

## 2018-08-21 DIAGNOSIS — M47812 Spondylosis without myelopathy or radiculopathy, cervical region: Secondary | ICD-10-CM | POA: Insufficient documentation

## 2018-08-21 LAB — URINALYSIS, COMPLETE
BILIRUBIN UA: NEGATIVE
Glucose, UA: NEGATIVE
Ketones, UA: NEGATIVE
Leukocytes, UA: NEGATIVE
Nitrite, UA: NEGATIVE
PH UA: 6 (ref 5.0–7.5)
PROTEIN UA: NEGATIVE
RBC, UA: NEGATIVE
SPEC GRAV UA: 1.02 (ref 1.005–1.030)
Urobilinogen, Ur: 0.2 mg/dL (ref 0.2–1.0)

## 2018-08-21 LAB — BLADDER SCAN AMB NON-IMAGING: Scan Result: 121

## 2018-08-21 NOTE — Progress Notes (Signed)
08/21/2018 9:58 AM   Scott Bowen 04/24/1941 [id number]  Referring provider: Marisue Ivan, MD [phone] [facility] [address] [facility] [place], [place] [address]  No chief complaint on file.   HPI: Patient is a 77 year old Caucasian male with BPH with LU TS and ED who presents today with the complaint of dysuria and a weak urinary stream.  He was last seen in our office on 01/22/2016 with Dr. Sherryl Barters.  He was placed on Rapaflo for his BPH.  He was also having low libido, but his testosterone level was found to be 831.  He is not a candidate for PDE5i due to his hives he experienced with Cialis.  He states that Sunday afternoon, he started having a burning sensation on the top of his thighs.  There is an occasional sharpness. The sensation is dependent on movement.   He is also having nocturia and a slow urinary flow in the early mornings, but these are baseline.  His UA today is negative.    His PVR is 121 mL.  He denies any incontinence of urine or stool.    Patient denies any gross hematuria, dysuria or suprapubic/flank pain.  Patient denies any fevers, chills, nausea or vomiting.    PMH: Past Medical History:  Diagnosis Date  . Allergic rhinitis 06/10/2015   Overview:  Followed by Dr. Delfin Edis   . Anxiety   . Arthritis   . DDD (degenerative disc disease), cervical 06/10/2015  . Degenerative joint disease involving multiple joints 06/10/2015  . Depression   . Essential (primary) hypertension 06/10/2015  . Gastro-esophageal reflux disease without esophagitis 06/10/2015  . GERD (gastroesophageal reflux disease)   . Hyperlipemia   . Hypertension   . Lung cyst     Surgical History: Past Surgical History:  Procedure Laterality Date  . APPENDECTOMY  1961    Home Medications:  Allergies as of 08/21/2018      Reactions   Cialis [tadalafil] Hives, Itching   Omeprazole Hives   Doxycycline Rash   Erythromycin Ethylsuccinate Rash   Sulfa Antibiotics Hives, Rash   Tetracycline Rash      Medication List        Accurate as of 08/21/18  9:58 AM. Always use your most recent med list.          aspirin EC 81 MG tablet Take by mouth.   cetirizine 10 MG tablet Commonly known as:  ZYRTEC Take 10 mg by mouth once a week.   EPIPEN 2-PAK 0.3 mg/0.3 mL Soaj injection Generic drug:  EPINEPHrine Reported on 01/07/2016   fluticasone 50 MCG/ACT nasal spray Commonly known as:  FLONASE   lisinopril 10 MG tablet Commonly known as:  PRINIVIL,ZESTRIL TAKE ONE (1) TABLET EACH DAY   montelukast 10 MG tablet Commonly known as:  SINGULAIR   OPCON-A OP Apply to eye.   ranitidine 150 MG tablet Commonly known as:  ZANTAC Take 150 mg by mouth 2 (two) times daily.   silodosin 8 MG Caps capsule Commonly known as:  RAPAFLO Take 1 capsule (8 mg total) by mouth daily with breakfast.   XIIDRA 5 % Soln Generic drug:  Lifitegrast Apply to eye.       Allergies:  Allergies  Allergen Reactions  . Cialis [Tadalafil] Hives and Itching  . Omeprazole Hives  . Doxycycline Rash  . Erythromycin Ethylsuccinate Rash  . Sulfa Antibiotics Hives and Rash  . Tetracycline Rash    Family History: Family History  Problem Relation Age of Onset  .  Bladder Cancer Neg Hx   . Prostate cancer Neg Hx   . Kidney cancer Neg Hx     Social History:  reports that he has never smoked. He has never used smokeless tobacco. He reports that he does not drink alcohol or use drugs.  ROS: UROLOGY Frequent Urination?: No Hard to postpone urination?: No Burning/pain with urination?: No Get up at night to urinate?: Yes Leakage of urine?: No Urine stream starts and stops?: No Trouble starting stream?: No Do you have to strain to urinate?: No Blood in urine?: No Urinary tract infection?: No Sexually transmitted disease?: No Injury to kidneys or bladder?: No Painful intercourse?: No Weak stream?: Yes Erection problems?: Yes Penile pain?: No  Gastrointestinal Nausea?:  No Vomiting?: No Indigestion/heartburn?: No Diarrhea?: No Constipation?: No  Constitutional Fever: No Night sweats?: No Weight loss?: No Fatigue?: No  Skin Skin rash/lesions?: No Itching?: No  Eyes Blurred vision?: No Double vision?: No  Ears/Nose/Throat Sore throat?: No Sinus problems?: No  Hematologic/Lymphatic Swollen glands?: No Easy bruising?: No  Cardiovascular Leg swelling?: No Chest pain?: No  Respiratory Cough?: Yes Shortness of breath?: No  Endocrine Excessive thirst?: No  Musculoskeletal Back pain?: No Joint pain?: No  Neurological Headaches?: No Dizziness?: No  Psychologic Depression?: No Anxiety?: No  Physical Exam: BP 137/87 (BP Location: Left Arm, Patient Position: Sitting, Cuff Size: Normal)   Ht 5\' 10"  (1.778 m)   Wt 167 lb 4.8 oz (75.9 kg)   BMI 24.01 kg/m   Constitutional: Well nourished. Alert and oriented, No acute distress. HEENT: [place] AT, moist mucus membranes. Trachea midline, no masses. Cardiovascular: No clubbing, cyanosis, or edema. Respiratory: Normal respiratory effort, no increased work of breathing. GI: Abdomen is soft, non tender, non distended, no abdominal masses. Liver and spleen not palpable.  No hernias appreciated.  Stool sample for occult testing is not indicated.   GU: No CVA tenderness.  No bladder fullness or masses.  Patient with circumcised phallus.   Urethral meatus is patent.  No penile discharge. No penile lesions or rashes. Scrotum without lesions, cysts, rashes and/or edema.  Testicles are located scrotally bilaterally. No masses are appreciated in the testicles. Left and right epididymis are normal. Rectal: Patient with  normal sphincter tone. Anus and perineum without scarring or rashes. No rectal masses are appreciated. Prostate is approximately 50 grams, no nodules are appreciated. Seminal vesicles are normal. Skin: No rashes, bruises or suspicious lesions. Lymph: No cervical or inguinal  adenopathy. Neurologic: Grossly intact, no focal deficits, moving all 4 extremities. Psychiatric: Normal mood and affect.  Laboratory Data: Lab Results  Component Value Date   WBC 5.6 11/20/2015   HGB 14.4 11/20/2015   HCT 42.5 11/20/2015   MCV 89.1 11/20/2015   PLT 217 11/20/2015    Lab Results  Component Value Date   CREATININE 0.95 11/20/2015    No results found for: PSA  Lab Results  Component Value Date   TESTOSTERONE 831 01/01/2016    No results found for: HGBA1C  Urinalysis Negative.  See Epic.  I have reviewed the labs.  Assessment & Plan:    1. BPH with LUTS Continue conservative management, avoiding bladder irritants and timed voiding's Patient does not want to take medications as when he read the sheet concerning the side effects he was afraid to take them PSA today as he is having signs of possible LBP He continues to ask for an antibiotic during the visit as he feels that his symptoms are due to prostatitis -  I have declined prescribe an antibiotic as there is no sign of infection at this time RTC in 12 months for IPS'S and exam   2. 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  Michiel Cowboy, PA-C  [facility] [address] [place], [place] [address] [phone]

## 2018-08-22 ENCOUNTER — Telehealth: Payer: Self-pay

## 2018-08-22 LAB — PSA: Prostate Specific Ag, Serum: 2.3 ng/mL (ref 0.0–4.0)

## 2018-08-22 NOTE — Telephone Encounter (Signed)
Called pt no answer. LM for pt informing him of lab results below.

## 2018-08-22 NOTE — Telephone Encounter (Signed)
-----   Message from Harle Battiest, PA-C sent at 08/22/2018 10:25 AM EDT ----- Please let Mr. Ketchem know that his PSA was normal.

## 2018-08-24 LAB — CULTURE, URINE COMPREHENSIVE

## 2018-08-27 ENCOUNTER — Telehealth: Payer: Self-pay

## 2018-08-27 NOTE — Telephone Encounter (Signed)
-----   Message from Harle Battiest, PA-C sent at 08/27/2018  7:48 AM EDT ----- Please let Mr. Wavra know that his urine culture was negative.

## 2018-08-27 NOTE — Telephone Encounter (Signed)
Patient notified

## 2019-06-18 ENCOUNTER — Emergency Department: Payer: Medicare Other

## 2019-06-18 ENCOUNTER — Emergency Department
Admission: EM | Admit: 2019-06-18 | Discharge: 2019-06-18 | Disposition: A | Discharge status: Home / Self Care | Payer: Medicare Other | Attending: Emergency Medicine | Admitting: Emergency Medicine

## 2019-06-18 DIAGNOSIS — Z7982 Long term (current) use of aspirin: Secondary | ICD-10-CM | POA: Diagnosis not present

## 2019-06-18 DIAGNOSIS — Z79899 Other long term (current) drug therapy: Secondary | ICD-10-CM | POA: Insufficient documentation

## 2019-06-18 DIAGNOSIS — I10 Essential (primary) hypertension: Secondary | ICD-10-CM | POA: Insufficient documentation

## 2019-06-18 DIAGNOSIS — R030 Elevated blood-pressure reading, without diagnosis of hypertension: Secondary | ICD-10-CM | POA: Diagnosis present

## 2019-06-18 LAB — BASIC METABOLIC PANEL
Anion gap: 8 (ref 5–15)
BUN: 16 mg/dL (ref 8–23)
CO2: 24 mmol/L (ref 22–32)
Calcium: 9.2 mg/dL (ref 8.9–10.3)
Chloride: 106 mmol/L (ref 98–111)
Creatinine, Ser: 0.95 mg/dL (ref 0.61–1.24)
GFR calc Af Amer: >60 mL/min (ref >60)
GFR calc non Af Amer: >60 mL/min (ref >60)
Glucose, Bld: 136 mg/dL — ABNORMAL HIGH (ref 70–99)
Potassium: 3.8 mmol/L (ref 3.5–5.1)
Sodium: 138 mmol/L (ref 135–145)

## 2019-06-18 LAB — TROPONIN I (HIGH SENSITIVITY): Troponin I (High Sensitivity): 6 ng/L (ref <18)

## 2019-06-18 LAB — CBC
HCT: 41.6 % (ref 39.0–52.0)
Hemoglobin: 14 g/dL (ref 13.0–17.0)
MCH: 30.5 pg (ref 26.0–34.0)
MCHC: 33.7 g/dL (ref 30.0–36.0)
MCV: 90.6 fL (ref 80.0–100.0)
Platelets: 256 10*3/uL (ref 150–400)
RBC: 4.59 MIL/uL (ref 4.22–5.81)
RDW: 12.6 % (ref 11.5–15.5)
WBC: 6.5 10*3/uL (ref 4.0–10.5)
nRBC: 0 % (ref 0.0–0.2)

## 2019-06-18 NOTE — ED Triage Notes (Signed)
Patient c/o hypertension, systolic BP over 670 at home. Patient denies other symptoms. Patient reports recent BP medication changes.

## 2019-06-18 NOTE — ED Provider Notes (Signed)
[facility] Emergency Department Provider Note  ____________________________________________  Time seen: Approximately 6:45 AM  I have reviewed the triage vital signs and the nursing notes.   HISTORY  Chief Complaint Hypertension   HPI Scott Bowen is a 78 y.o. male the history of hypertension, hyperlipidemia, anxiety  who presents for evaluation of elevated blood pressure.  Patient was on lisinopril for several years with good control of his blood pressure.  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  Therefore a week ago his primary care doctor switched him from lisinopril to losartan.  Since then he has had difficulty controlling his blood pressure.  Initially started on 50 mg, increased to 75 mg, and since yesterday increase to 100 mg.  Patient reports that he is BP has been between 180 and 200s at home.  He denies dizziness, headache, slurred speech, facial droop, unilateral weakness or numbness, chest pain, shortness of breath.  He reports that he has severe anxiety and when his blood pressure is elevated he usually gets very anxious and keeps checking his blood pressure every 5 minutes.  Past Medical History:  Diagnosis Date  . Allergic rhinitis 06/10/2015   Overview:  Followed by Dr. Ayesha Mohair   . Anxiety   . Arthritis   . DDD (degenerative disc disease), cervical 06/10/2015  . Degenerative joint disease involving multiple joints 06/10/2015  . Depression   . Essential (primary) hypertension 06/10/2015  . Gastro-esophageal reflux disease without esophagitis 06/10/2015  . GERD (gastroesophageal reflux disease)   . Hyperlipemia   . Hypertension   . Lung cyst     Patient Active Problem List   Diagnosis Date Noted  . DJD (degenerative joint disease), cervical 08/21/2018  . Mild episode of recurrent major depressive disorder ([place]) 03/01/2017  . Degeneration of intervertebral disc of cervical region 10/07/2015  . Pure  hypercholesterolemia 10/07/2015  . Sex counseling 06/18/2015  . Other specified counseling 06/18/2015  . Allergic rhinitis 06/10/2015  . DDD (degenerative disc disease), cervical 06/10/2015  . Essential (primary) hypertension 06/10/2015  . Gastro-esophageal reflux disease without esophagitis 06/10/2015  . Degenerative joint disease involving multiple joints 06/10/2015  . Psoriasis 06/10/2015  . Hypercholesterolemia without hypertriglyceridemia 06/10/2015  . Triggering of digit 06/10/2015    Past Surgical History:  Procedure Laterality Date  . APPENDECTOMY  1961    Prior to Admission medications   Medication Sig Start Date End Date Taking? Authorizing Provider  aspirin EC 81 MG tablet Take by mouth.    [provider]  cetirizine (ZYRTEC) 10 MG tablet Take 10 mg by mouth once a week.    [provider]  EPIPEN 2-PAK 0.3 MG/0.3ML SOAJ injection Reported on 01/07/2016 09/21/15   [provider]  fluticasone Asencion Islam) 50 MCG/ACT nasal spray  09/25/15   [provider]  Lifitegrast Shirley Friar) 5 % SOLN Apply to eye.    [provider]  lisinopril (PRINIVIL,ZESTRIL) 10 MG tablet TAKE ONE (1) TABLET EACH DAY 01/06/15   [provider]  montelukast (SINGULAIR) 10 MG tablet  04/06/15   [provider]  Naphazoline-Pheniramine (OPCON-A OP) Apply to eye.    [provider]  ranitidine (ZANTAC) 150 MG tablet Take 150 mg by mouth 2 (two) times daily.    [provider]  silodosin (RAPAFLO) 8 MG CAPS capsule Take 1 capsule (8 mg total) by mouth daily with breakfast. Patient not taking: Reported on 06/06/2018 01/22/16   Nickie Retort, MD  Allergies Cialis [tadalafil], Omeprazole, Doxycycline, Erythromycin ethylsuccinate, Sulfa antibiotics, and Tetracycline  Family History  Problem Relation Age of Onset  . Bladder Cancer Neg Hx   . Prostate cancer Neg Hx   . Kidney cancer Neg Hx     Social History Social  History   Tobacco Use  . Smoking status: Never Smoker  . Smokeless tobacco: Never Used  Substance Use Topics  . Alcohol use: No    Alcohol/week: 0.0 standard drinks  . Drug use: No    Review of Systems  Constitutional: Negative for fever. Eyes: Negative for visual changes. ENT: Negative for sore throat. Neck: No neck pain  Cardiovascular: Negative for chest pain. Respiratory: Negative for shortness of breath. Gastrointestinal: Negative for abdominal pain, vomiting or diarrhea. Genitourinary: Negative for dysuria. Musculoskeletal: Negative for back pain. Skin: Negative for rash. Neurological: Negative for headaches, weakness or numbness. Psych: No SI or HI  ____________________________________________   PHYSICAL EXAM:  VITAL SIGNS: ED Triage Vitals [06/18/19 0126]  Enc Vitals Group     BP (!) 187/113     Pulse Rate 99     Resp 18     Temp 98.3 F (36.8 C)     Temp Source Oral     SpO2 97 %     Weight 167 lb 8.8 oz (76 kg)     Height      Head Circumference      Peak Flow      Pain Score 0     Pain Loc      Pain Edu?      Excl. in GC?     Constitutional: Alert and oriented. Well appearing and in no apparent distress. HEENT:      Head: Normocephalic and atraumatic.         Eyes: Conjunctivae are normal. Sclera is non-icteric.       Mouth/Throat: Mucous membranes are moist.       Neck: Supple with no signs of meningismus. Cardiovascular: Regular rate and rhythm. No murmurs, gallops, or rubs. 2+ symmetrical distal pulses are present in all extremities. No JVD. Respiratory: Normal respiratory effort. Lungs are clear to auscultation bilaterally. No wheezes, crackles, or rhonchi.  Gastrointestinal: Soft, non tender, and non distended with positive bowel sounds. No rebound or guarding. Musculoskeletal: Nontender with normal range of motion in all extremities. No edema, cyanosis, or erythema of extremities. Neurologic: Normal speech and language. Face is symmetric.  Moving all extremities. No gross focal neurologic deficits are appreciated. Skin: Skin is warm, dry and intact. No rash noted. Psychiatric: Mood and affect are normal. Speech and behavior are normal.  ____________________________________________   LABS (all labs ordered are listed, but only abnormal results are displayed)  Labs Reviewed  BASIC METABOLIC PANEL - Abnormal; Notable for the following components:      Result Value   Glucose, Bld 136 (*)    All other components within normal limits  CBC  TROPONIN I (HIGH SENSITIVITY)   ____________________________________________  EKG  ED ECG REPORT I, Nita Sicklearolina Omni Dunsworth, the attending physician, personally viewed and interpreted this ECG.   Normal sinus rhythm, rate of 87, normal intervals, normal axis, no ST elevations or depressions.  Unchanged from prior. ____________________________________________  RADIOLOGY  I have personally reviewed the images performed during this visit and I agree with the Radiologist's read.   Interpretation by Radiologist:  Dg Chest 2 View  Result Date: 06/18/2019 CLINICAL DATA:  Hypertension EXAM: CHEST - 2 VIEW COMPARISON:  CT chest dated 11/20/2015  FINDINGS: Lungs are clear.  No pleural effusion or pneumothorax. The heart is normal in size. Mild degenerative changes of the visualized thoracolumbar spine. IMPRESSION: Normal chest radiographs. Electronically Signed   By: Charline BillsSriyesh  Krishnan M.D.   On: 06/18/2019 02:11     ____________________________________________   PROCEDURES  Procedure(s) performed: None Procedures Critical Care performed:  None ____________________________________________   INITIAL IMPRESSION / ASSESSMENT AND PLAN / ED COURSE  78 y.o. male the history of hypertension, hyperlipidemia, anxiety  who presents for evaluation of asymptomatic hypertension x 1 week since having his lisinopril switched to losartan due to cough.  Patient's PCP has been adjusting the medication.   Patient reports that he felt very anxious last night because his blood pressure was in the 200s and he was afraid he was going to have a heart attack or a stroke.  Patient is extremely anxious here in the emergency room as well.  His BP here is 187/90.  He is completely asymptomatic.  EKG with no ischemic changes.  Labs showing no endorgan damage.  Recommended the patient continues to follow with his PCP keeping a blood pressure diary so the PCP can continue to adjust his blood pressure medicine.  Discussed my return precautions including any signs of stroke, chest pain, dizziness, shortness of breath.  Patient is comfortable with this plan.  Will discharge home at this time.      As part of my medical decision making, I reviewed the following data within the electronic MEDICAL RECORD NUMBER Nursing notes reviewed and incorporated, Labs reviewed , EKG interpreted , Old EKG reviewed, Old chart reviewed, Radiograph reviewed , Notes from prior ED visits and [place] Controlled Substance Database   Patient was evaluated in Emergency Department today for the symptoms described in the history of present illness. Patient was evaluated in the context of the global COVID-19 pandemic, which necessitated consideration that the patient might be at risk for infection with the SARS-CoV-2 virus that causes COVID-19. Institutional protocols and algorithms that pertain to the evaluation of patients at risk for COVID-19 are in a state of rapid change based on information released by regulatory bodies including the CDC and federal and state organizations. These policies and algorithms were followed during the patient's care in the ED.   ____________________________________________   FINAL CLINICAL IMPRESSION(S) / ED DIAGNOSES   Final diagnoses:  Essential hypertension      NEW MEDICATIONS STARTED DURING THIS VISIT:  ED Discharge Orders    None       Note:  This document was prepared using Dragon voice recognition  software and may include unintentional dictation errors.    Don PerkingVeronese, [place], MD 06/18/19 [phone]

## 2020-01-19 IMAGING — CR CHEST - 2 VIEW
2 series · 2 of 2 positions shown · non-contrast
Comparison: CT chest dated 11/20/2015

CLINICAL DATA: Hypertension

EXAM:
CHEST - 2 VIEW

[chest pa]
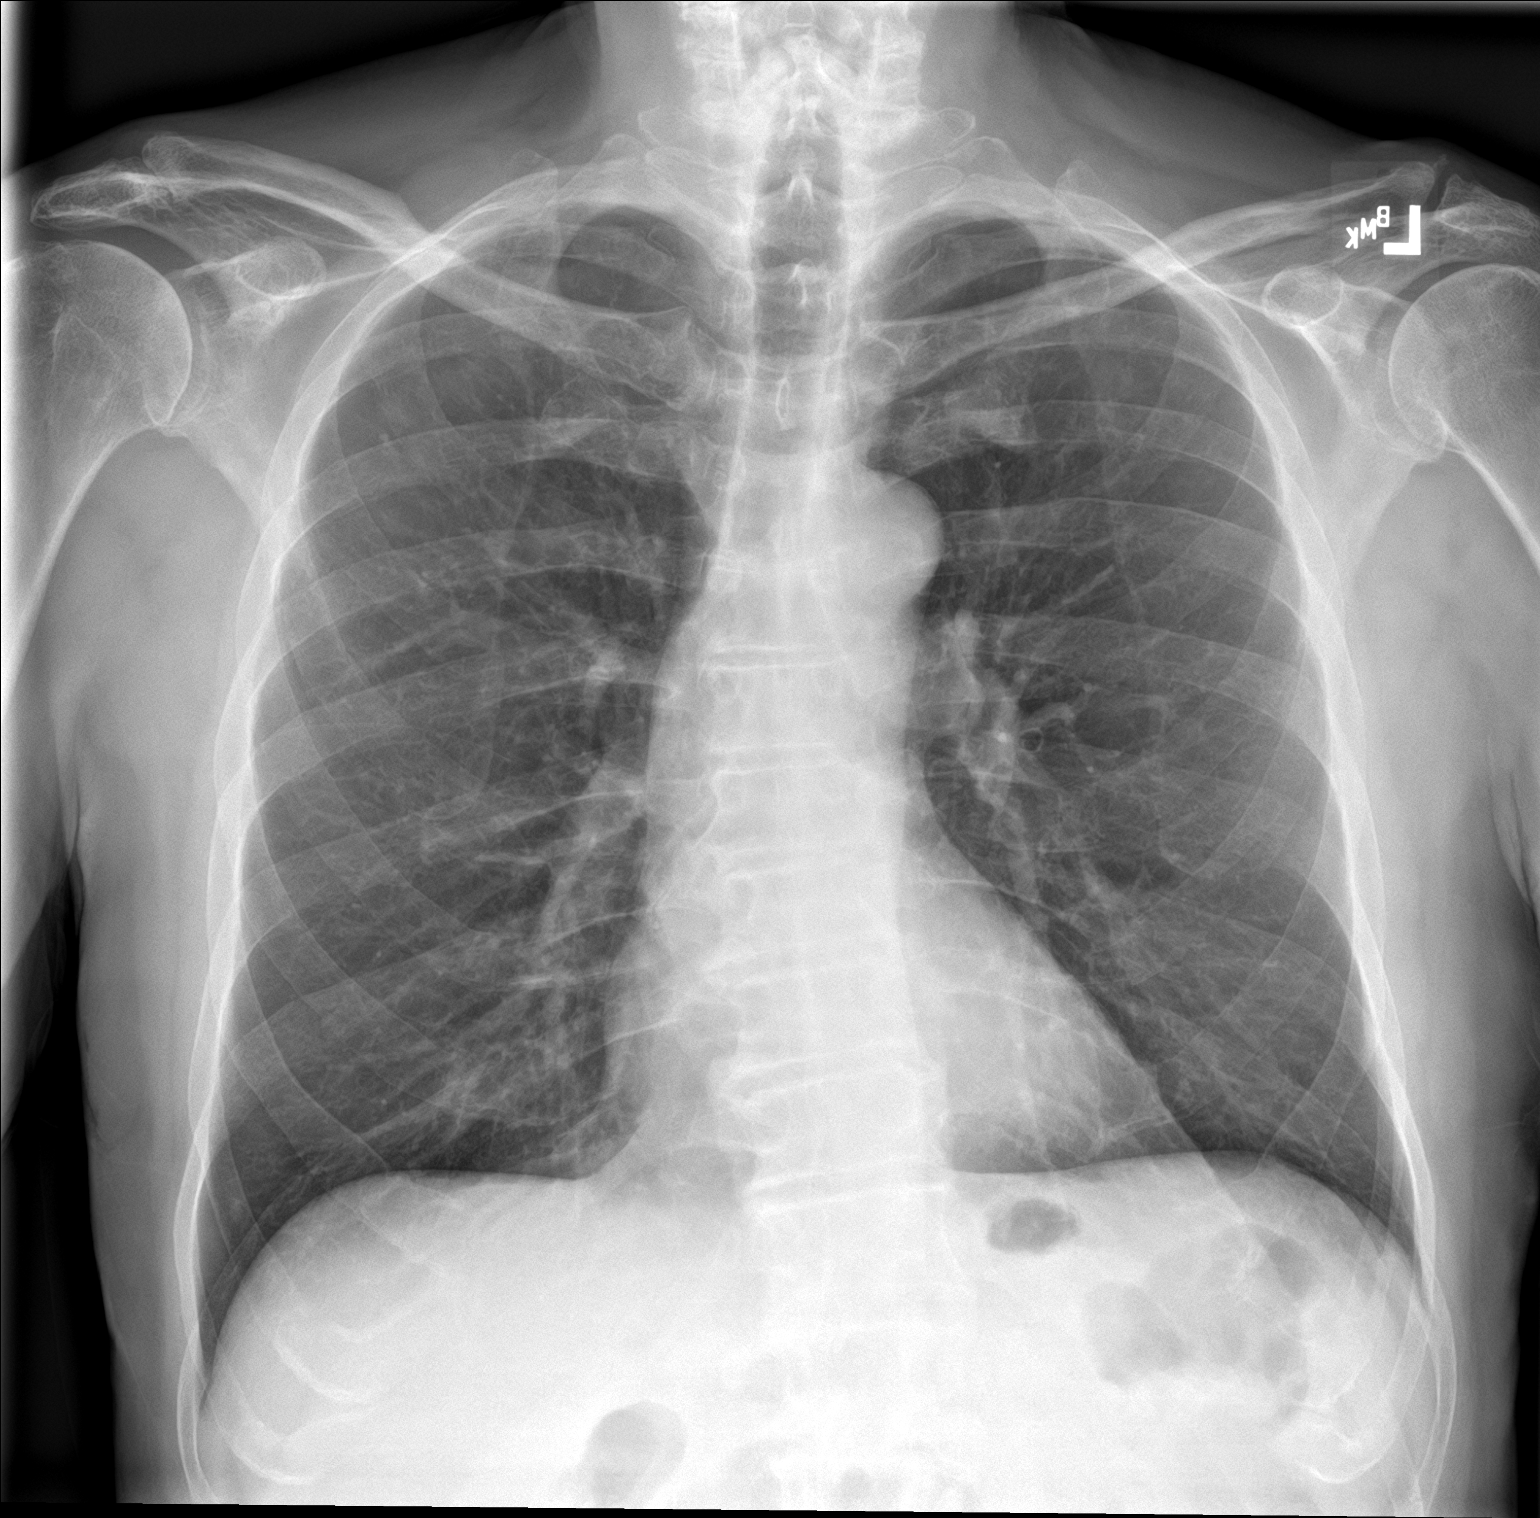

[chest lat]
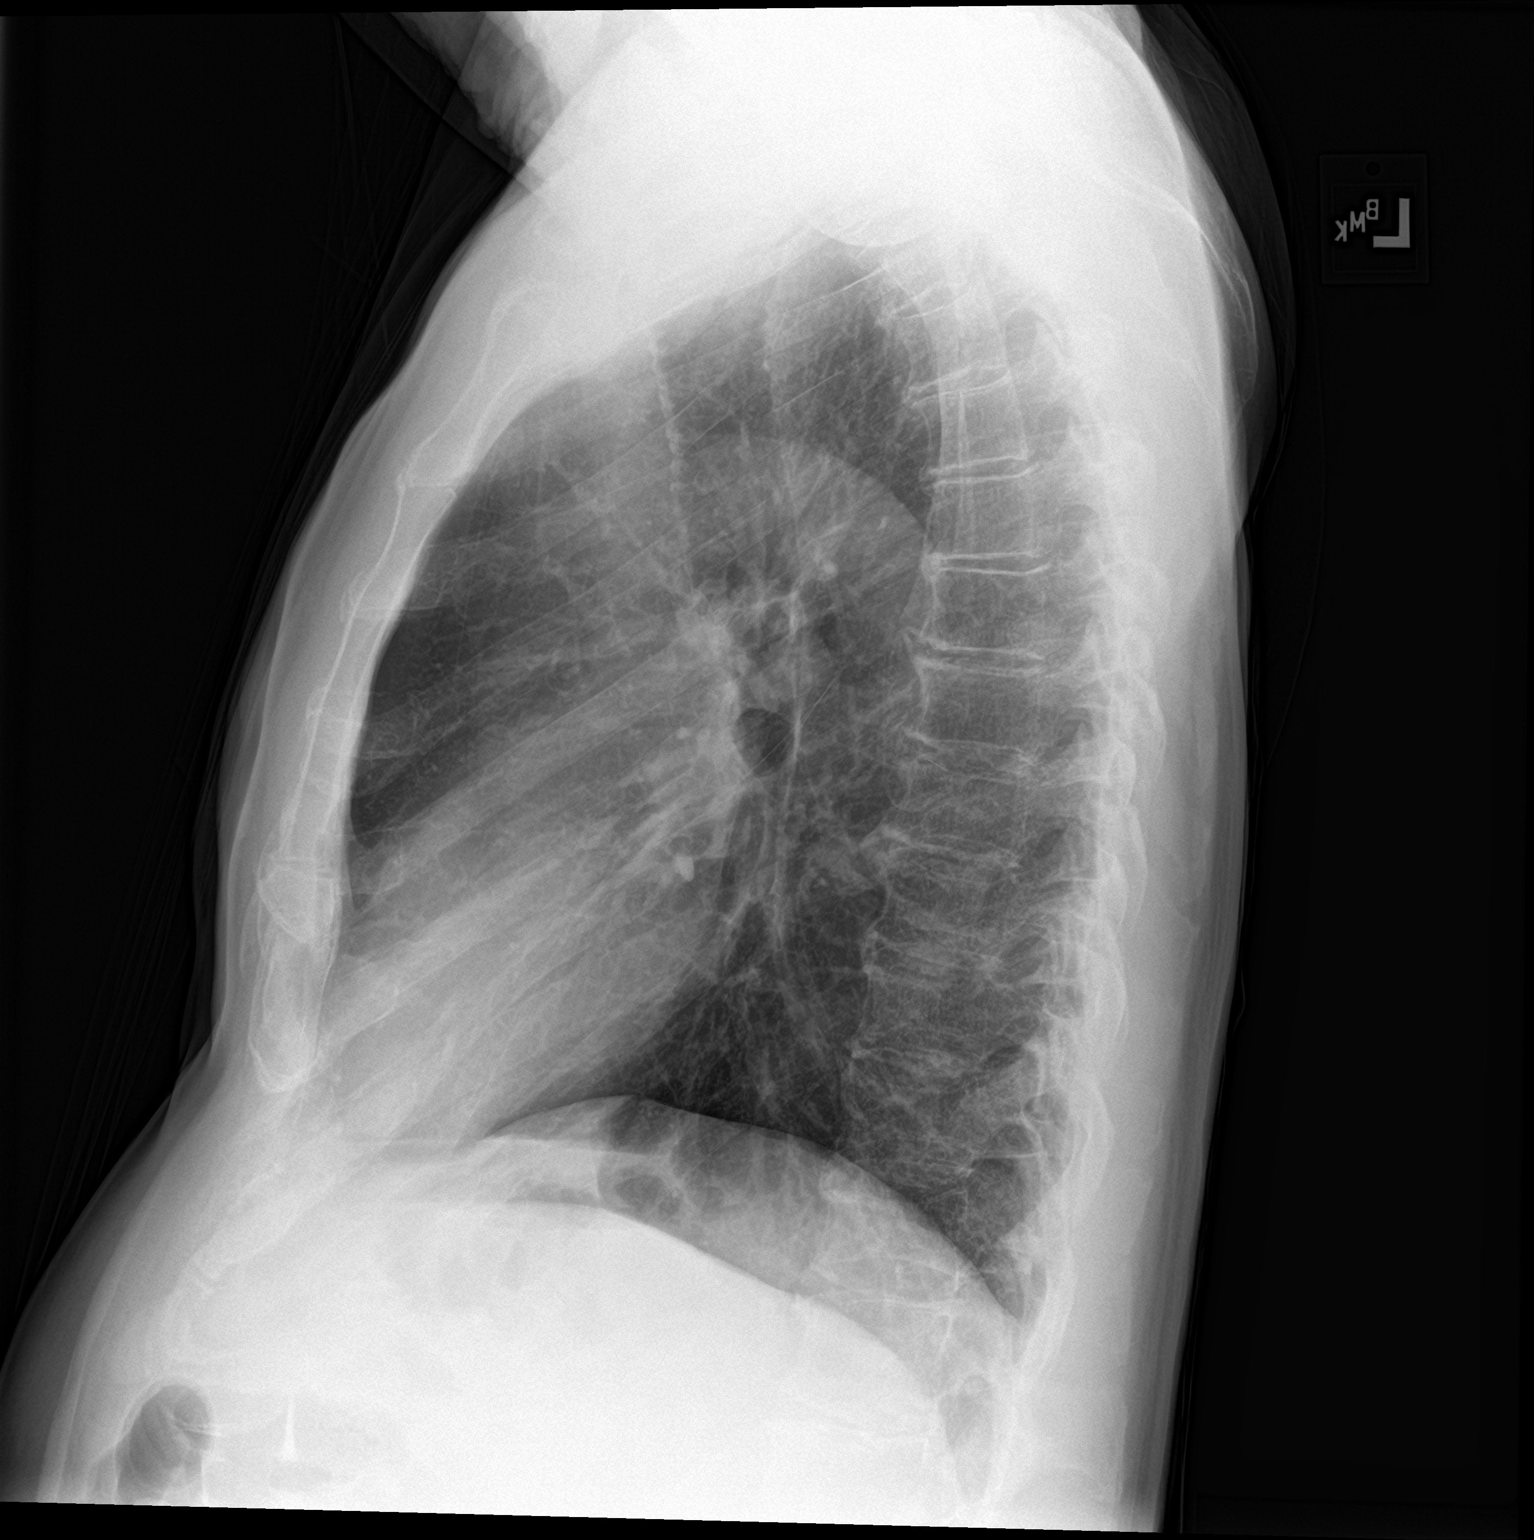

[2 of 2 positions shown; findings below may reference images not displayed]

FINDINGS: Lungs are clear.  No pleural effusion or pneumothorax.

The heart is normal in size.

Mild degenerative changes of the visualized thoracolumbar spine.
IMPRESSION: Normal chest radiographs.

## 2020-09-28 ENCOUNTER — Ambulatory Visit: Payer: Medicare Other | Admitting: Dermatology

## 2021-06-10 ENCOUNTER — Encounter: Payer: Medicare Other | Admitting: Dermatology
